# Patient Record
Sex: Female | Born: 1975 | Race: Black or African American | Hispanic: No | Marital: Married | Smoking: Former smoker
Health system: Southern US, Community
[De-identification: ages and names within clinical notes are randomized; demographics above are authoritative.]

## PROBLEM LIST (undated history)

## (undated) DIAGNOSIS — G43909 Migraine, unspecified, not intractable, without status migrainosus: Secondary | ICD-10-CM

## (undated) DIAGNOSIS — U071 COVID-19: Secondary | ICD-10-CM

## (undated) DIAGNOSIS — T7840XA Allergy, unspecified, initial encounter: Secondary | ICD-10-CM

## (undated) DIAGNOSIS — E0500 Thyrotoxicosis with diffuse goiter without thyrotoxic crisis or storm: Secondary | ICD-10-CM

## (undated) DIAGNOSIS — S42301A Unspecified fracture of shaft of humerus, right arm, initial encounter for closed fracture: Secondary | ICD-10-CM

## (undated) HISTORY — DX: Unspecified fracture of shaft of humerus, right arm, initial encounter for closed fracture: S42.301A

## (undated) HISTORY — DX: Allergy, unspecified, initial encounter: T78.40XA

## (undated) HISTORY — PX: OTHER SURGICAL HISTORY: SHX169

## (undated) HISTORY — DX: Migraine, unspecified, not intractable, without status migrainosus: G43.909

## (undated) HISTORY — DX: COVID-19: U07.1

## (undated) HISTORY — DX: Thyrotoxicosis with diffuse goiter without thyrotoxic crisis or storm: E05.00

---

## 2013-03-27 LAB — HM COLONOSCOPY

## 2015-12-16 LAB — HM PAP SMEAR: HM Pap smear: NORMAL

## 2020-02-26 ENCOUNTER — Encounter: Payer: Self-pay | Admitting: Internal Medicine

## 2020-02-26 ENCOUNTER — Ambulatory Visit (INDEPENDENT_AMBULATORY_CARE_PROVIDER_SITE_OTHER): Payer: BC Managed Care – PPO | Admitting: Internal Medicine

## 2020-02-26 ENCOUNTER — Other Ambulatory Visit: Payer: Self-pay

## 2020-02-26 VITALS — BP 110/80 | HR 62 | Temp 97.4°F | Ht 69.76 in | Wt 146.6 lb

## 2020-02-26 DIAGNOSIS — Z13220 Encounter for screening for lipoid disorders: Secondary | ICD-10-CM

## 2020-02-26 DIAGNOSIS — Z0000 Encounter for general adult medical examination without abnormal findings: Secondary | ICD-10-CM

## 2020-02-26 DIAGNOSIS — E0500 Thyrotoxicosis with diffuse goiter without thyrotoxic crisis or storm: Secondary | ICD-10-CM | POA: Insufficient documentation

## 2020-02-26 DIAGNOSIS — G43911 Migraine, unspecified, intractable, with status migrainosus: Secondary | ICD-10-CM

## 2020-02-26 DIAGNOSIS — G43109 Migraine with aura, not intractable, without status migrainosus: Secondary | ICD-10-CM

## 2020-02-26 DIAGNOSIS — Z1231 Encounter for screening mammogram for malignant neoplasm of breast: Secondary | ICD-10-CM

## 2020-02-26 DIAGNOSIS — Z113 Encounter for screening for infections with a predominantly sexual mode of transmission: Secondary | ICD-10-CM

## 2020-02-26 DIAGNOSIS — Z1329 Encounter for screening for other suspected endocrine disorder: Secondary | ICD-10-CM

## 2020-02-26 DIAGNOSIS — E559 Vitamin D deficiency, unspecified: Secondary | ICD-10-CM

## 2020-02-26 DIAGNOSIS — D61818 Other pancytopenia: Secondary | ICD-10-CM

## 2020-02-26 DIAGNOSIS — Z1159 Encounter for screening for other viral diseases: Secondary | ICD-10-CM

## 2020-02-26 DIAGNOSIS — E538 Deficiency of other specified B group vitamins: Secondary | ICD-10-CM

## 2020-02-26 DIAGNOSIS — Z1389 Encounter for screening for other disorder: Secondary | ICD-10-CM

## 2020-02-26 DIAGNOSIS — M2550 Pain in unspecified joint: Secondary | ICD-10-CM

## 2020-02-26 DIAGNOSIS — G43909 Migraine, unspecified, not intractable, without status migrainosus: Secondary | ICD-10-CM | POA: Insufficient documentation

## 2020-02-26 MED ORDER — SUMATRIPTAN SUCCINATE 25 MG PO TABS: 25.0000 mg | ORAL_TABLET | ORAL | 5 refills | Status: DC | PRN

## 2020-02-26 NOTE — Progress Notes (Signed)
Chief Complaint  Patient presents with  . Establish Care   New pt Marie Lewis  1. Migraines 2x last week and since middle school was on meds but doesn't remember name of meds now doing excedrine migraine otc gets nausea/slurred speech, loss of feeling in extremities and some nausea. She may have been on propranolol in the past. 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 This h/a pain will last x 6-7 hours and wants medications. She does wear glasses at night for night driving  2. Graves dx'ed since 2014 was on methimazole x 2 years and saw endocrine but moved and does not have  Review of Systems  Constitutional: Negative for weight loss.  HENT: Negative for hearing loss.   Eyes: Negative for blurred vision.  Respiratory: Negative for shortness of breath.   Cardiovascular: Negative for chest pain.  Gastrointestinal: Negative for abdominal pain.  Musculoskeletal: Negative for falls.  Skin: Negative for rash.  Neurological: Negative for headaches.  Psychiatric/Behavioral: Negative for depression.   Past Medical History:  Diagnosis Date  . Graves disease    was on methimazole and disc thyroid removal and RAI never done in the past dx' in 2014 on meds x 2 years saw endocrine in [place]  . Migraines   . Right arm fracture    3rd grade    Past Surgical History:  Procedure Laterality Date  . teeth extracted     x4 for braces   Family History  Problem Relation Age of Onset  . Arthritis Mother   . Diabetes Mother   . Hypertension Mother   . Diabetes Father   . Hypertension Father        ? FH of dad he is adopted  . Ovarian cancer Sister   . Depression Sister   . Breast cancer Maternal Aunt        mid 57s   . Prostate cancer Maternal Uncle        x 2 uncles   Social History   Socioeconomic History  . Marital status: Married    Spouse name: Not on file  . Number of children: Not on file  . Years of education: Not on file   . Highest education level: Not on file  Occupational History  . Not on file  Tobacco Use  . Smoking status: Former Smoker    Types: Cigars  . Smokeless tobacco: Never Used  . Tobacco comment: Former social cigar smoker.   Substance and Sexual Activity  . Alcohol use: Not on file  . Drug use: Not on file  . Sexual activity: Not on file  Other Topics Concern  . Not on file  Social History Narrative   Married 3 kids as of 02/26/20 girl 30 y.o, boy 46 y.o and girl 44 y.o   Masters degree school counselor    From [place]   No etoh or smoking    Social Determinants of Health   Financial Resource Strain:   . Difficulty of Paying Living Expenses:   Food Insecurity:   . Worried About Programme researcher, broadcasting/film/video in the Last Year:   . Barista in the Last Year:   Transportation Needs:   . Freight forwarder (Medical):   Marland Kitchen Lack of Transportation (Non-Medical):   Physical Activity:   . Days of Exercise per Week:   . Minutes of Exercise per Session:   Stress:   . Feeling of  Stress :   Social Connections:   . Frequency of Communication with Friends and Family:   . Frequency of Social Gatherings with Friends and Family:   . Attends Religious Services:   . Active Member of Clubs or Organizations:   . Attends Archivist Meetings:   Marland Kitchen Marital Status:   Intimate Partner Violence:   . Fear of Current or Ex-Partner:   . Emotionally Abused:   Marland Kitchen Physically Abused:   . Sexually Abused:    Current Meds  Medication Sig  . Multiple Vitamin (MULTIVITAMIN ADULT PO) Take by mouth daily.   Allergies  Allergen Reactions  . [place]     And related raw fruits: apples, peaches, prunes, pears, cherries, apricots, carrots, celery, almonds, hazelnuts and peanuts.   No results found for this or any previous visit (from the past 2160 hour(s)). Objective  Body mass index is 21.18 kg/m. Wt Readings from Last 3 Encounters:  02/26/20 146 lb 9.6 oz (66.5 kg)   Temp Readings from  Last 3 Encounters:  02/26/20 (!) 97.4 F (36.3 C) (Temporal)   BP Readings from Last 3 Encounters:  02/26/20 110/80   Pulse Readings from Last 3 Encounters:  02/26/20 62    Physical Exam Vitals and nursing note reviewed.  Constitutional:      Appearance: Normal appearance. She is well-developed and well-groomed.  HENT:     Head: Normocephalic and atraumatic.  Eyes:     Conjunctiva/sclera: Conjunctivae normal.     Pupils: Pupils are equal, round, and reactive to light.  Cardiovascular:     Rate and Rhythm: Normal rate and regular rhythm.     Heart sounds: Normal heart sounds. No murmur.  Pulmonary:     Effort: Pulmonary effort is normal.     Breath sounds: Normal breath sounds.  Skin:    General: Skin is warm and dry.  Neurological:     General: No focal deficit present.     Mental Status: She is alert and oriented to person, place, and time. Mental status is at baseline.     Gait: Gait normal.  Psychiatric:        Attention and Perception: Attention and perception normal.        Mood and Affect: Mood and affect normal.        Speech: Speech normal.        Behavior: Behavior normal. Behavior is cooperative.        Thought Content: Thought content normal.        Cognition and Memory: Cognition and memory normal.        Judgment: Judgment normal.     Assessment  Plan  Graves disease - Plan: TSH, T4, free, T3, free Likely will need to establish with endocrine   Intractable migraine with status migrainosus, unspecified migraine type - Plan: SUMAtriptan (IMITREX) 25 MG tablet F/u in 3 months migraines seem complicated consider neurology in the future given options today  Consider prn zofran  HM -cpe at f/u  sch fasting labs Flu shot did not have 2020 Tdap thinks w/in 10 years covid 2/2 moderna  hpv vaccine consider in future  mammo referred [facility] Pap h/o abnormal Dr. Leonides Schanz appt pending 04/2020 lmp 02/22/20  Colonoscopy consider age 62; EGD.colonoscopy  [place] had in the past get copy of report Never smoker  ROI Dr. Martinique Garrison in [facility]   Provider: Dr. Olivia Mackie McLean-Scocuzza-Internal Medicine

## 2020-02-26 NOTE — Patient Instructions (Addendum)
Dr. Stormy Fabian*  [facility]  Dr. Jose Persia in [place] (braces)   Eye  [place] [facility]  [facility] eye  Dr. B nice  Dr. Lucrezia Starch [facility] in [place] mammogram  [facility] or [facility] or [facility]  Magnesium oxide 250-400 mg daily    Dr. Sherryll Burger [facility] [place]   [facility] neurology  [facility]   [facility] Dr. Blair Heys     Eletriptan tablets What is this medicine? ELETRIPTAN (el ih TRIP tan) is used to treat migraines with or without aura. An aura is a strange feeling or visual disturbance that warns you of an attack. It is not used to prevent migraines. This medicine may be used for other purposes; ask your health care provider or pharmacist if you have questions. COMMON BRAND NAME(S): Relpax What should I tell my health care provider before I take this medicine? They need to know if you have any of these conditions:  cigarette smoker  circulation problems in fingers and toes  diabetes  heart disease  high blood pressure  high cholesterol  history of irregular heartbeat  history of stroke  kidney disease  liver disease  stomach or intestine problems  an unusual or allergic reaction to eletriptan, other medicines, foods, dyes, or preservatives  pregnant or trying to get pregnant  breast-feeding How should I use this medicine? Take this medicine by mouth with a glass of water. Follow the directions on the prescription label. Do not take it more often than directed. Talk to your pediatrician regarding the use of this medicine in children. Special care may be needed. Overdosage: If you think you have taken too much of this medicine contact a poison control center or emergency room at once. NOTE: This medicine is only for you. Do not share this medicine with others. What if I miss a dose? This does not apply. This medicine is not for regular use. What may  interact with this medicine? Do not take this medicine with any of the following medications:  ceritinib  certain antibiotics like clarithromycin or telithromycin  certain antivirals for HIV or hepatitis  certain medicines for fungal infections like ketoconazole, itraconazole, or posaconazole  certain medicines for migraine headache like almotriptan, eletriptan, frovatriptan, naratriptan, rizatriptan, sumatriptan, zolmitriptan  chloramphenicol  conivaptan  ergot alkaloids like dihydroergotamine, ergonovine, ergotamine, methylergonovine  idelalisib  mifepristone  nefazodone  ribociclib This medicine may also interact with the following medications:  certain medicines for depression, anxiety, or psychotic disorders  MAOIs like Carbex, Eldepryl, Marplan, Nardil, and Parnate This list may not describe all possible interactions. Give your health care provider a list of all the medicines, herbs, non-prescription drugs, or dietary supplements you use. Also tell them if you smoke, drink alcohol, or use illegal drugs. Some items may interact with your medicine. What should I watch for while using this medicine? Visit your healthcare professional for regular checks on your progress. Tell your healthcare professional if your symptoms do not start to get better or if they get worse. You may get drowsy or dizzy. Do not drive, use machinery, or do anything that needs mental alertness until you know how this medicine affects you. Do not stand up or sit up quickly, especially if you are an older patient. This reduces the risk of dizzy or fainting spells. Alcohol may interfere with the effect of this medicine. Your mouth may get dry. Chewing sugarless gum or sucking hard candy and  drinking plenty of water may help. Contact your healthcare professional if the problem does not go away or is severe. If you take migraine medicines for 10 or more days a month, your migraines may get worse. Keep a diary  of headache days and medicine use. Contact your healthcare professional if your migraine attacks occur more frequently. What side effects may I notice from receiving this medicine? Side effects that you should report to your doctor or health care professional as soon as possible:  allergic reactions like skin rash, itching or hives, swelling of the face, lips, or tongue  chest pain or chest tightness  signs and symptoms of a dangerous change in heartbeat or heart rhythm like chest pain; dizziness; fast, irregular heartbeat; palpitations; feeling faint or lightheaded; falls; breathing problems  signs and symptoms of a stroke like changes in vision; confusion; trouble speaking or understanding; severe headaches; sudden numbness or weakness of the face, arm or leg; trouble walking; dizziness; loss of balance or coordination  signs and symptoms of serotonin syndrome like irritable; confusion; diarrhea; fast or irregular heartbeat; muscle twitching; stiff muscles; trouble walking; sweating; high fever; seizures; chills; vomiting Side effects that usually do not require medical attention (report to your doctor or health care professional if they continue or are bothersome):  diarrhea  dizziness  drowsiness  dry mouth  headache  nausea, vomiting  pain, tingling, numbness in the hands or feet  stomach pain This list may not describe all possible side effects. Call your doctor for medical advice about side effects. You may report side effects to FDA at 1-800-FDA-1088. Where should I keep my medicine? Keep out of the reach of children. Store at room temperature between 15 and 30 degrees C (59 and 86 degrees F). Throw away any unused medicine after the expiration date. NOTE: This sheet is a summary. It may not cover all possible information. If you have questions about this medicine, talk to your doctor, pharmacist, or health care provider.  2020 Elsevier/Gold Standard (2018-03-20  14:44:50)  Sumatriptan tablets What is this medicine? SUMATRIPTAN (soo ma TRIP tan) is used to treat migraines with or without aura. An aura is a strange feeling or visual disturbance that warns you of an attack. It is not used to prevent migraines. This medicine may be used for other purposes; ask your health care provider or pharmacist if you have questions. COMMON BRAND NAME(S): Imitrex, Migraine Pack What should I tell my health care provider before I take this medicine? They need to know if you have any of these conditions:  cigarette smoker  circulation problems in fingers and toes  diabetes  heart disease  high blood pressure  high cholesterol  history of irregular heartbeat  history of stroke  kidney disease  liver disease  stomach or intestine problems  an unusual or allergic reaction to sumatriptan, other medicines, foods, dyes, or preservatives  pregnant or trying to get pregnant  breast-feeding How should I use this medicine? Take this medicine by mouth with a glass of water. Follow the directions on the prescription label. Do not take it more often than directed. Talk to your pediatrician regarding the use of this medicine in children. Special care may be needed. Overdosage: If you think you have taken too much of this medicine contact a poison control center or emergency room at once. NOTE: This medicine is only for you. Do not share this medicine with others. What if I miss a dose? This does not apply. This medicine  is not for regular use. What may interact with this medicine? Do not take this medicine with any of the following medicines:  certain medicines for migraine headache like almotriptan, eletriptan, frovatriptan, naratriptan, rizatriptan, sumatriptan, zolmitriptan  ergot alkaloids like dihydroergotamine, ergonovine, ergotamine, methylergonovine  MAOIs like Carbex, Eldepryl, Marplan, Nardil, and Parnate This medicine may also interact with  the following medications:  certain medicines for depression, anxiety, or psychotic disorders This list may not describe all possible interactions. Give your health care provider a list of all the medicines, herbs, non-prescription drugs, or dietary supplements you use. Also tell them if you smoke, drink alcohol, or use illegal drugs. Some items may interact with your medicine. What should I watch for while using this medicine? Visit your healthcare professional for regular checks on your progress. Tell your healthcare professional if your symptoms do not start to get better or if they get worse. You may get drowsy or dizzy. Do not drive, use machinery, or do anything that needs mental alertness until you know how this medicine affects you. Do not stand up or sit up quickly, especially if you are an older patient. This reduces the risk of dizzy or fainting spells. Alcohol may interfere with the effect of this medicine. Tell your healthcare professional right away if you have any change in your eyesight. If you take migraine medicines for 10 or more days a month, your migraines may get worse. Keep a diary of headache days and medicine use. Contact your healthcare professional if your migraine attacks occur more frequently. What side effects may I notice from receiving this medicine? Side effects that you should report to your doctor or health care professional as soon as possible:  allergic reactions like skin rash, itching or hives, swelling of the face, lips, or tongue  changes in vision  chest pain or chest tightness  signs and symptoms of a dangerous change in heartbeat or heart rhythm like chest pain; dizziness; fast, irregular heartbeat; palpitations; feeling faint or lightheaded; falls; breathing problems  signs and symptoms of a stroke like changes in vision; confusion; trouble speaking or understanding; severe headaches; sudden numbness or weakness of the face, arm or leg; trouble  walking; dizziness; loss of balance or coordination  signs and symptoms of serotonin syndrome like irritable; confusion; diarrhea; fast or irregular heartbeat; muscle twitching; stiff muscles; trouble walking; sweating; high fever; seizures; chills; vomiting Side effects that usually do not require medical attention (report to your doctor or health care professional if they continue or are bothersome):  diarrhea  dizziness  drowsiness  dry mouth  headache  nausea, vomiting  pain, tingling, numbness in the hands or feet  stomach pain This list may not describe all possible side effects. Call your doctor for medical advice about side effects. You may report side effects to FDA at 1-800-FDA-1088. Where should I keep my medicine? Keep out of the reach of children. Store at room temperature between 2 and 30 degrees C (36 and 86 degrees F). Throw away any unused medicine after the expiration date. NOTE: This sheet is a summary. It may not cover all possible information. If you have questions about this medicine, talk to your doctor, pharmacist, or health care provider.  2020 Elsevier/Gold Standard (2018-03-20 15:05:37)  Migraine Headache A migraine headache is an intense, throbbing pain on one side or both sides of the head. Migraine headaches may also cause other symptoms, such as nausea, vomiting, and sensitivity to light and noise. A migraine headache can last  from 4 hours to 3 days. Talk with your doctor about what things may bring on (trigger) your migraine headaches. What are the causes? The exact cause of this condition is not known. However, a migraine may be caused when nerves in the brain become irritated and release chemicals that cause inflammation of blood vessels. This inflammation causes pain. This condition may be triggered or caused by:  Drinking alcohol.  Smoking.  Taking medicines, such as: ? Medicine used to treat chest pain (nitroglycerin). ? Birth control  pills. ? Estrogen. ? Certain blood pressure medicines.  Eating or drinking products that contain nitrates, glutamate, aspartame, or tyramine. Aged cheeses, chocolate, or caffeine may also be triggers.  Doing physical activity. Other things that may trigger a migraine headache include:  Menstruation.  Pregnancy.  Hunger.  Stress.  Lack of sleep or too much sleep.  Weather changes.  Fatigue. What increases the risk? The following factors may make you more likely to experience migraine headaches:  Being a certain age. This condition is more common in people who are 83-35 years old.  Being female.  Having a family history of migraine headaches.  Being Caucasian.  Having a mental health condition, such as depression or anxiety.  Being obese. What are the signs or symptoms? The main symptom of this condition is pulsating or throbbing pain. This pain may:  Happen in any area of the head, such as on one side or both sides.  Interfere with daily activities.  Get worse with physical activity.  Get worse with exposure to bright lights or loud noises. Other symptoms may include:  Nausea.  Vomiting.  Dizziness.  General sensitivity to bright lights, loud noises, or smells. Before you get a migraine headache, you may get warning signs (an aura). An aura may include:  Seeing flashing lights or having blind spots.  Seeing bright spots, halos, or zigzag lines.  Having tunnel vision or blurred vision.  Having numbness or a tingling feeling.  Having trouble talking.  Having muscle weakness. Some people have symptoms after a migraine headache (postdromal phase), such as:  Feeling tired.  Difficulty concentrating. How is this diagnosed? A migraine headache can be diagnosed based on:  Your symptoms.  A physical exam.  Tests, such as: ? CT scan or an MRI of the head. These imaging tests can help rule out other causes of headaches. ? Taking fluid from the  spine (lumbar puncture) and analyzing it (cerebrospinal fluid analysis, or CSF analysis). How is this treated? This condition may be treated with medicines that:  Relieve pain.  Relieve nausea.  Prevent migraine headaches. Treatment for this condition may also include:  Acupuncture.  Lifestyle changes like avoiding foods that trigger migraine headaches.  Biofeedback.  Cognitive behavioral therapy. Follow these instructions at home: Medicines  Take over-the-counter and prescription medicines only as told by your health care provider.  Ask your health care provider if the medicine prescribed to you: ? Requires you to avoid driving or using heavy machinery. ? Can cause constipation. You may need to take these actions to prevent or treat constipation:  Drink enough fluid to keep your urine pale yellow.  Take over-the-counter or prescription medicines.  Eat foods that are high in fiber, such as beans, whole grains, and fresh fruits and vegetables.  Limit foods that are high in fat and processed sugars, such as fried or sweet foods. Lifestyle  Do not drink alcohol.  Do not use any products that contain nicotine or tobacco, such as  cigarettes, e-cigarettes, and chewing tobacco. If you need help quitting, ask your health care provider.  Get at least 8 hours of sleep every night.  Find ways to manage stress, such as meditation, deep breathing, or yoga. General instructions      Keep a journal to find out what may trigger your migraine headaches. For example, write down: ? What you eat and drink. ? How much sleep you get. ? Any change to your diet or medicines.  If you have a migraine headache: ? Avoid things that make your symptoms worse, such as bright lights. ? It may help to lie down in a dark, quiet room. ? Do not drive or use heavy machinery. ? Ask your health care provider what activities are safe for you while you are experiencing symptoms.  Keep all follow-up  visits as told by your health care provider. This is important. Contact a health care provider if:  You develop symptoms that are different or more severe than your usual migraine headache symptoms.  You have more than 15 headache days in one month. Get help right away if:  Your migraine headache becomes severe.  Your migraine headache lasts longer than 72 hours.  You have a fever.  You have a stiff neck.  You have vision loss.  Your muscles feel weak or like you cannot control them.  You start to lose your balance often.  You have trouble walking.  You faint.  You have a seizure. Summary  A migraine headache is an intense, throbbing pain on one side or both sides of the head. Migraines may also cause other symptoms, such as nausea, vomiting, and sensitivity to light and noise.  This condition may be treated with medicines and lifestyle changes. You may also need to avoid certain things that trigger a migraine headache.  Keep a journal to find out what may trigger your migraine headaches.  Contact your health care provider if you have more than 15 headache days in a month or you develop symptoms that are different or more severe than your usual migraine headache symptoms. This information is not intended to replace advice given to you by your health care provider. Make sure you discuss any questions you have with your health care provider. Document Revised: 12/28/2018 Document Reviewed: 10/18/2018 Elsevier Patient Education  2020 [employer].      HPV Vaccine Information for Parents  HPV (human papillomavirus) is a common virus that spreads from person to person through sexual contact. It can spread during vaginal, anal, or oral sex. There are many types of HPV viruses, and some may cause cancer. Your child can get a vaccination to prevent HPV infection and cancer. The vaccine is both safe and effective. It is recommended for boys and girls at about 12-21 years of age.  Getting the vaccination at this age--before becoming sexually active--gives your child the best chance at protection from HPV infection through adulthood. How can HPV affect my child? HPV infection can cause:  Genital warts.  Mouth or throat cancer (oropharyngeal cancer).  Anal cancer.  Cervical, vulvar, or vaginal cancer.  Penile cancer. During pregnancy, HPV infection can be passed to the baby. This infection can cause warts to develop in a baby's throat and windpipe. What actions can I take to lower my child's risk for HPV? To lower your child's risk for HPV infection, have him or her get the HPV vaccination before becoming sexually active. The best time for vaccination is between ages 3 and  12, though it can be given to children as young as 45 years old. If your child gets the first dose before age 76, the vaccination can be given as 2 shots (doses), 6-12 months apart. In some situations, 3 doses are needed:  If your child starts the vaccine before age 20 but does not have a second dose within 6-12 months, your child will need 3 doses to complete the vaccination. When your child has the first dose, it is important to make an appointment for the next shot and keep the appointment.  Teens who are not vaccinated before age 69 will need 3 doses given within 6 months.  If your child has a weak immune system, he or she may need 3 doses. Young adults can also get the vaccination, even if they are already sexually active and even if they have already been infected with HPV. The vaccination can still help prevent the types of cancer-causing HPV that a person has not been infected with. What are the risks and benefits of the HPV vaccine? Benefits The main benefit of getting vaccinated is to prevent certain cancers, including:  Cervical, vulvar, and vaginal cancer in females.  Penile cancer in males.  Oral and anal cancer in both males and females. The risk of these cancers is lower if your  child gets vaccinated before he or she becomes sexually active. The vaccine also prevents genital warts caused by HPV. Risks The risks, although low, include side effects or reactions to the vaccine. Very few reactions have been reported, but they can include:  Soreness, redness, or swelling at the injection site.  Dizziness or headache.  Fever. Who should not get the HPV vaccine or should wait to get it? Some children should not get the HPV vaccine or should wait. Discuss the risks and benefits of the vaccine with your child's health care provider if your child:  Has had a severe allergic reaction to other vaccinations.  Is allergic to yeast.  Has a fever.  Has had a recent illness.  Is pregnant or may be pregnant. Where to find more information  Centers for Disease Control and Prevention: https://www.boyd-meyer.org/  American Academy of Pediatrics: healthychildren.org Summary  HPV (human papillomavirus) is a common virus that spreads from person to person through sexual contact. It can spread during vaginal, anal, or oral sex.  Your child can get a vaccination to prevent HPV infection and cancer. It is best to get the vaccination before becoming sexually active.  The HPV vaccine can protect your child from genital warts and certain types of cancer, including cancer of the cervix, throat, mouth, vulva, vagina, anus, and penis.  The HPV vaccine is both safe and effective.  The best time for boys and girls to get the vaccination is when they are between ages 55 and 43. This information is not intended to replace advice given to you by your health care provider. Make sure you discuss any questions you have with your health care provider. Document Revised: 02/25/2019 Document Reviewed: 11/23/2017 Elsevier Patient Education  [employer].

## 2020-02-27 ENCOUNTER — Other Ambulatory Visit (INDEPENDENT_AMBULATORY_CARE_PROVIDER_SITE_OTHER): Payer: BC Managed Care – PPO

## 2020-02-27 ENCOUNTER — Other Ambulatory Visit: Payer: BC Managed Care – PPO

## 2020-02-27 DIAGNOSIS — Z13220 Encounter for screening for lipoid disorders: Secondary | ICD-10-CM

## 2020-02-27 DIAGNOSIS — Z1389 Encounter for screening for other disorder: Secondary | ICD-10-CM

## 2020-02-27 DIAGNOSIS — Z0000 Encounter for general adult medical examination without abnormal findings: Secondary | ICD-10-CM | POA: Diagnosis not present

## 2020-02-27 DIAGNOSIS — E0500 Thyrotoxicosis with diffuse goiter without thyrotoxic crisis or storm: Secondary | ICD-10-CM | POA: Diagnosis not present

## 2020-02-27 DIAGNOSIS — E559 Vitamin D deficiency, unspecified: Secondary | ICD-10-CM | POA: Diagnosis not present

## 2020-02-27 DIAGNOSIS — Z1329 Encounter for screening for other suspected endocrine disorder: Secondary | ICD-10-CM

## 2020-02-27 LAB — COMPREHENSIVE METABOLIC PANEL
ALT: 8 U/L (ref 0–35)
AST: 13 U/L (ref 0–37)
Albumin: 4.5 g/dL (ref 3.5–5.2)
Alkaline Phosphatase: 45 U/L (ref 39–117)
BUN: 7 mg/dL (ref 6–23)
CO2: 26 mEq/L (ref 19–32)
Calcium: 9.1 mg/dL (ref 8.4–10.5)
Chloride: 105 mEq/L (ref 96–112)
Creatinine, Ser: 0.86 mg/dL (ref 0.40–1.20)
GFR: 86.58 mL/min (ref >60.00)
Glucose, Bld: 83 mg/dL (ref 70–99)
Potassium: 3.9 mEq/L (ref 3.5–5.1)
Sodium: 137 mEq/L (ref 135–145)
Total Bilirubin: 0.4 mg/dL (ref 0.2–1.2)
Total Protein: 8 g/dL (ref 6.0–8.3)

## 2020-02-27 LAB — LIPID PANEL
Cholesterol: 113 mg/dL (ref 0–200)
HDL: 42.3 mg/dL (ref >39.00)
LDL Cholesterol: 62 mg/dL (ref 0–99)
NonHDL: 71.04
Total CHOL/HDL Ratio: 3
Triglycerides: 43 mg/dL (ref 0.0–149.0)
VLDL: 8.6 mg/dL (ref 0.0–40.0)

## 2020-02-27 LAB — CBC WITH DIFFERENTIAL/PLATELET
Basophils Absolute: 0 10*3/uL (ref 0.0–0.1)
Basophils Relative: 0.3 % (ref 0.0–3.0)
Eosinophils Absolute: 0.1 10*3/uL (ref 0.0–0.7)
Eosinophils Relative: 1.7 % (ref 0.0–5.0)
HCT: 34.6 % — ABNORMAL LOW (ref 36.0–46.0)
Hemoglobin: 11.3 g/dL — ABNORMAL LOW (ref 12.0–15.0)
Lymphocytes Relative: 52.1 % — ABNORMAL HIGH (ref 12.0–46.0)
Lymphs Abs: 1.5 10*3/uL (ref 0.7–4.0)
MCHC: 32.6 g/dL (ref 30.0–36.0)
MCV: 80.3 fl (ref 78.0–100.0)
Monocytes Absolute: 0.3 10*3/uL (ref 0.1–1.0)
Monocytes Relative: 9.8 % (ref 3.0–12.0)
Neutro Abs: 1.1 10*3/uL — ABNORMAL LOW (ref 1.4–7.7)
Neutrophils Relative %: 36.1 % — ABNORMAL LOW (ref 43.0–77.0)
Platelets: 113 10*3/uL — ABNORMAL LOW (ref 150.0–400.0)
RBC: 4.31 Mil/uL (ref 3.87–5.11)
RDW: 14.6 % (ref 11.5–15.5)
WBC: 3 10*3/uL — ABNORMAL LOW (ref 4.0–10.5)

## 2020-02-27 LAB — VITAMIN D 25 HYDROXY (VIT D DEFICIENCY, FRACTURES): VITD: 30.57 ng/mL (ref 30.00–100.00)

## 2020-02-27 LAB — T4, FREE: Free T4: 0.75 ng/dL (ref 0.60–1.60)

## 2020-02-27 LAB — T3, FREE: T3, Free: 3.1 pg/mL (ref 2.3–4.2)

## 2020-02-27 LAB — TSH: TSH: 0.6 u[IU]/mL (ref 0.35–4.50)

## 2020-02-28 ENCOUNTER — Telehealth: Payer: Self-pay | Admitting: Internal Medicine

## 2020-02-28 LAB — URINALYSIS, ROUTINE W REFLEX MICROSCOPIC
Bacteria, UA: NONE SEEN /HPF
Bilirubin Urine: NEGATIVE
Glucose, UA: NEGATIVE
Hyaline Cast: NONE SEEN /LPF
Ketones, ur: NEGATIVE
Leukocytes,Ua: NEGATIVE
Nitrite: NEGATIVE
Protein, ur: NEGATIVE
Specific Gravity, Urine: 1.015 (ref 1.001–1.03)
WBC, UA: NONE SEEN /HPF (ref 0–5)
pH: 6.5 (ref 5.0–8.0)

## 2020-02-28 NOTE — Telephone Encounter (Signed)
Left message to return call. Needing to speak with patient urgently today.

## 2020-02-28 NOTE — Telephone Encounter (Signed)
Left message to return call 

## 2020-02-28 NOTE — Telephone Encounter (Signed)
-----   Message from Bevelyn Buckles, MD sent at 02/28/2020  7:57 AM EDT ----- Have pt activate my chart please   Cholesterol normal Liver kidneys normal Thyroid labs normal  Vitamin D low normal rec otc vitamin D3 2000 IU daily   All of blood counts are down  -white blood cell count, she is anemia and platelets low  -I recommend hematology consult  -is she agreeable hematology at [facility] or locally here [facility]?   Urine 2+ blood was she on cycle? Any UTI sx's?   I want to order more labs like HIV, etc to work her up before seeing hematology, is she agreeable?   Is she ok with MRI brain for migraines/h/a?   We have to use dye to get a better pic ok with this?   Sch repeat labs in our office if wanted by pt and I will order further labs until can f/u hematology

## 2020-03-02 ENCOUNTER — Telehealth: Payer: Self-pay

## 2020-03-02 NOTE — Telephone Encounter (Signed)
Please place future lab orders. Thank you! 

## 2020-03-03 DIAGNOSIS — D61818 Other pancytopenia: Secondary | ICD-10-CM

## 2020-03-03 HISTORY — DX: Other pancytopenia: D61.818

## 2020-03-03 NOTE — Addendum Note (Signed)
Addended by: Quentin Ore on: 03/03/2020 08:01 AM   Modules accepted: Orders

## 2020-03-04 ENCOUNTER — Other Ambulatory Visit (INDEPENDENT_AMBULATORY_CARE_PROVIDER_SITE_OTHER): Payer: BC Managed Care – PPO

## 2020-03-04 ENCOUNTER — Other Ambulatory Visit
Admission: RE | Admit: 2020-03-04 | Discharge: 2020-03-04 | Disposition: A | Discharge status: Home / Self Care | Payer: BC Managed Care – PPO | Attending: Internal Medicine | Admitting: Internal Medicine

## 2020-03-04 ENCOUNTER — Other Ambulatory Visit: Payer: Self-pay

## 2020-03-04 DIAGNOSIS — M2550 Pain in unspecified joint: Secondary | ICD-10-CM

## 2020-03-04 DIAGNOSIS — Z1159 Encounter for screening for other viral diseases: Secondary | ICD-10-CM

## 2020-03-04 DIAGNOSIS — E538 Deficiency of other specified B group vitamins: Secondary | ICD-10-CM | POA: Diagnosis not present

## 2020-03-04 DIAGNOSIS — G43109 Migraine with aura, not intractable, without status migrainosus: Secondary | ICD-10-CM

## 2020-03-04 DIAGNOSIS — D61818 Other pancytopenia: Secondary | ICD-10-CM | POA: Diagnosis not present

## 2020-03-04 DIAGNOSIS — Z113 Encounter for screening for infections with a predominantly sexual mode of transmission: Secondary | ICD-10-CM

## 2020-03-04 LAB — VITAMIN B12: Vitamin B-12: 328 pg/mL (ref 211–911)

## 2020-03-04 LAB — FOLATE: Folate: 19.6 ng/mL (ref >5.9)

## 2020-03-04 LAB — C-REACTIVE PROTEIN: CRP: <1 mg/dL (ref 0.5–20.0)

## 2020-03-04 LAB — SEDIMENTATION RATE: Sed Rate: 19 mm/hr (ref 0–20)

## 2020-03-04 NOTE — Addendum Note (Signed)
Addended by: Aerilynn Goin I on: 03/04/2020 09:45 AM   Modules accepted: Orders  

## 2020-03-04 NOTE — Addendum Note (Signed)
Addended by: Kamorie Aldous I on: 03/04/2020 09:44 AM   Modules accepted: Orders  

## 2020-03-04 NOTE — Addendum Note (Signed)
Addended by: Bonnell Public I on: 03/04/2020 09:45 AM   Modules accepted: Orders

## 2020-03-04 NOTE — Addendum Note (Signed)
Addended by: Jalisha Enneking L on: 03/04/2020 09:07 AM ° ° Modules accepted: Orders ° °

## 2020-03-04 NOTE — Addendum Note (Signed)
Addended by: Bonnell Public I on: 03/04/2020 09:44 AM   Modules accepted: Orders

## 2020-03-04 NOTE — Addendum Note (Signed)
Addended by: Glendel Jaggers I on: 03/04/2020 09:44 AM   Modules accepted: Orders  

## 2020-03-04 NOTE — Addendum Note (Signed)
Addended by: Harrie Foreman on: 03/04/2020 09:08 AM   Modules accepted: Orders

## 2020-03-04 NOTE — Addendum Note (Signed)
Addended by: Marlon Vonruden L on: 03/04/2020 09:07 AM ° ° Modules accepted: Orders ° °

## 2020-03-04 NOTE — Addendum Note (Signed)
Addended by: Jaylena Holloway L on: 03/04/2020 09:08 AM ° ° Modules accepted: Orders ° °

## 2020-03-04 NOTE — Addendum Note (Signed)
Addended by: Finnean Cerami I on: 03/04/2020 09:45 AM   Modules accepted: Orders  

## 2020-03-04 NOTE — Addendum Note (Signed)
Addended by: Eliya Bubar L on: 03/04/2020 09:07 AM ° ° Modules accepted: Orders ° °

## 2020-03-04 NOTE — Addendum Note (Signed)
Addended by: Tymere Depuy L on: 03/04/2020 09:07 AM ° ° Modules accepted: Orders ° °

## 2020-03-04 NOTE — Addendum Note (Signed)
Addended by: Hikaru Delorenzo I on: 03/04/2020 09:44 AM   Modules accepted: Orders  

## 2020-03-04 NOTE — Addendum Note (Signed)
Addended by: Jazmeen Axtell I on: 03/04/2020 09:44 AM   Modules accepted: Orders  

## 2020-03-04 NOTE — Addendum Note (Signed)
Addended by: Dmoni Fortson L on: 03/04/2020 09:06 AM ° ° Modules accepted: Orders ° °

## 2020-03-04 NOTE — Addendum Note (Signed)
Addended by: Harrie Foreman on: 03/04/2020 09:06 AM   Modules accepted: Orders

## 2020-03-04 NOTE — Addendum Note (Signed)
Addended by: Ahnesti Townsend I on: 03/04/2020 09:45 AM   Modules accepted: Orders  

## 2020-03-04 NOTE — Addendum Note (Signed)
Addended by: Stran Raper I on: 03/04/2020 09:45 AM   Modules accepted: Orders  

## 2020-03-04 NOTE — Addendum Note (Signed)
Addended by: Harrie Foreman on: 03/04/2020 09:07 AM   Modules accepted: Orders

## 2020-03-04 NOTE — Addendum Note (Signed)
Addended by: Yareli Carthen L on: 03/04/2020 09:06 AM ° ° Modules accepted: Orders ° °

## 2020-03-05 ENCOUNTER — Telehealth: Payer: Self-pay | Admitting: Internal Medicine

## 2020-03-05 NOTE — Telephone Encounter (Signed)
Faxed request for patient's OBGYN records to Dr Sw[place] Garrison [facility].   ROI sent to scan.

## 2020-03-05 NOTE — Addendum Note (Signed)
Addended by: Quentin Ore on: 03/05/2020 05:44 PM   Modules accepted: Orders

## 2020-03-05 NOTE — Telephone Encounter (Signed)
Referred to hematology for pancytopenia all blood cell cts low white blood cells, blood cells and platelets   Inform pt   Marnee Guarneri, MD  [address]  [facility] [address]  [place], [place] [address]  [phone]  [phone] (Fax)

## 2020-03-06 ENCOUNTER — Telehealth: Payer: Self-pay | Admitting: Internal Medicine

## 2020-03-06 LAB — HIV ANTIBODY (ROUTINE TESTING W REFLEX): HIV 1&2 Ab, 4th Generation: NONREACTIVE

## 2020-03-06 LAB — HEPATITIS B SURFACE ANTIGEN: Hepatitis B Surface Ag: NONREACTIVE

## 2020-03-06 LAB — HEPATITIS B SURFACE ANTIBODY, QUANTITATIVE: Hep B S AB Quant (Post): <5 m[IU]/mL — ABNORMAL LOW (ref >=10)

## 2020-03-06 LAB — ANTI-NUCLEAR AB-TITER (ANA TITER): ANA Titer 1: 1:640 {titer} — ABNORMAL HIGH

## 2020-03-06 LAB — ANA: Anti Nuclear Antibody (ANA): POSITIVE — AB

## 2020-03-06 LAB — HEPATITIS C ANTIBODY
Hepatitis C Ab: NONREACTIVE
SIGNAL TO CUT-OFF: 0.08 (ref <1.00)

## 2020-03-06 LAB — CYCLIC CITRUL PEPTIDE ANTIBODY, IGG: Cyclic Citrullin Peptide Ab: <16 UNITS

## 2020-03-06 LAB — RHEUMATOID FACTOR: Rheumatoid fact SerPl-aCnc: <14 IU/mL (ref <14)

## 2020-03-06 NOTE — Telephone Encounter (Signed)
Left message to return call 

## 2020-03-06 NOTE — Telephone Encounter (Signed)
Left pt vm to call ofc regrading referral.

## 2020-03-06 NOTE — Telephone Encounter (Signed)
Left message to return call.  Letter mailed.

## 2020-03-20 NOTE — Telephone Encounter (Signed)
Patient was informed on 06/30 when called for her lab results.

## 2020-03-26 ENCOUNTER — Encounter: Payer: Self-pay | Admitting: Internal Medicine

## 2020-03-26 ENCOUNTER — Telehealth: Payer: Self-pay | Admitting: Internal Medicine

## 2020-03-26 DIAGNOSIS — E0500 Thyrotoxicosis with diffuse goiter without thyrotoxic crisis or storm: Secondary | ICD-10-CM

## 2020-03-26 NOTE — Telephone Encounter (Signed)
[facility]?   Thanks [employer]

## 2020-03-26 NOTE — Telephone Encounter (Signed)
Is [facility]ogram scheduled [facility] in [place]?

## 2020-03-31 NOTE — Telephone Encounter (Signed)
Order was faxed to [facility] on 03/13/2020.

## 2020-04-24 ENCOUNTER — Other Ambulatory Visit: Payer: Self-pay | Admitting: Obstetrics & Gynecology

## 2020-04-24 DIAGNOSIS — Z1231 Encounter for screening mammogram for malignant neoplasm of breast: Secondary | ICD-10-CM

## 2020-04-24 LAB — HM PAP SMEAR: HM Pap smear: NORMAL

## 2020-05-13 ENCOUNTER — Other Ambulatory Visit: Payer: Self-pay

## 2020-05-13 ENCOUNTER — Other Ambulatory Visit: Payer: BC Managed Care – PPO

## 2020-05-28 ENCOUNTER — Ambulatory Visit
Admission: RE | Admit: 2020-05-28 | Discharge: 2020-05-28 | Disposition: A | Discharge status: Home / Self Care | Payer: BC Managed Care – PPO | Source: Ambulatory Visit | Attending: Obstetrics & Gynecology | Admitting: Obstetrics & Gynecology

## 2020-05-28 ENCOUNTER — Other Ambulatory Visit: Payer: Self-pay

## 2020-05-28 DIAGNOSIS — Z1231 Encounter for screening mammogram for malignant neoplasm of breast: Secondary | ICD-10-CM | POA: Diagnosis present

## 2020-06-05 ENCOUNTER — Telehealth: Payer: Self-pay | Admitting: Internal Medicine

## 2020-06-05 ENCOUNTER — Telehealth: Payer: BC Managed Care – PPO | Admitting: Internal Medicine

## 2020-06-05 NOTE — Telephone Encounter (Signed)
What happened with hematology referral?

## 2020-06-05 NOTE — Telephone Encounter (Signed)
Left message to return call. If patient returning call, okay to schedule follow up in person. Dr French Ana McLean-Scocuzza prefers in person.

## 2020-06-05 NOTE — Telephone Encounter (Signed)
Pt is scheduled on 07/21/2020.

## 2020-06-05 NOTE — Telephone Encounter (Signed)
Please resch pt if agreeable

## 2021-04-27 ENCOUNTER — Telehealth: Payer: Self-pay | Admitting: Internal Medicine

## 2021-04-27 NOTE — Telephone Encounter (Signed)
Patient is requesting office information for a neurology referral before her appointment in December with Dr.Tracy.Pleas\e advise.

## 2021-04-27 NOTE — Telephone Encounter (Signed)
What does she need we previously discussed neurology is referral needed ?    [facility] Neurology Dr. Lucia Gaskins specializes in headaches    [facility]  Dr. Sherryll Burger Neurology [place]

## 2021-04-27 NOTE — Telephone Encounter (Signed)
Please advise, Patient last seen 02/2020.

## 2021-04-28 NOTE — Telephone Encounter (Signed)
Patient needing a referral, would like to see Dr. Sherryll Burger Neurology [place]

## 2021-04-29 ENCOUNTER — Telehealth: Payer: Self-pay | Admitting: Internal Medicine

## 2021-04-29 NOTE — Telephone Encounter (Signed)
Reason for Triage Call /in person: Headaches  Symptoms:Moderate  Pain Scale:none, location, head, and level of pain (1-10, 10 severe), 6  Onset: 4 days  Duration: 4 days  Medications:medication not used, Tylenol used but not effective  Last seen for this problem:Patient has referral to neurology per patient.  Outcome: To be seen in 24 hrs. At home care was also given to patient. If sx worsen they need to be seen per access nurse.  Attach Access Nurse Triage Note.

## 2021-04-29 NOTE — Telephone Encounter (Signed)
Patient informed, Due to the high volume of calls and your symptoms we have to forward your call to our Triage Nurse to expedient your call. Please hold for the transfer.  Patient transferred to Access Nurse. Due to having increased headaches and she is unsure what to do before next appointment in December.No openings available in office or virtual.

## 2021-04-30 ENCOUNTER — Other Ambulatory Visit: Payer: Self-pay | Admitting: Internal Medicine

## 2021-04-30 ENCOUNTER — Telehealth: Payer: Self-pay | Admitting: Internal Medicine

## 2021-04-30 ENCOUNTER — Other Ambulatory Visit: Payer: Self-pay

## 2021-04-30 DIAGNOSIS — G43911 Migraine, unspecified, intractable, with status migrainosus: Secondary | ICD-10-CM

## 2021-04-30 MED ORDER — SUMATRIPTAN SUCCINATE 50 MG PO TABS: 50.0000 mg | ORAL_TABLET | ORAL | 2 refills | Status: DC | PRN

## 2021-04-30 NOTE — Telephone Encounter (Signed)
Will refer to Dr. Sherryll Burger in [place] but he has no available appts soon  Do we in the clinic?  If not rec [facility] or [facility]

## 2021-04-30 NOTE — Telephone Encounter (Signed)
Also I refilled her imitrex increased dose to 50 from 25 and max is 200 mg in 24 hours

## 2021-04-30 NOTE — Telephone Encounter (Signed)
This has been addressed see other phrine note.

## 2021-04-30 NOTE — Telephone Encounter (Signed)
I called and spoke with patient. She waited to see if anyone could see her next week in office to discuss med options for HA. She stated that she was really waiting on referral to Dr. Sherryll Burger. I let her know that the referral was placed & that she may call to see if they can get her scheduled prior to them calling her. I have provided number. She stated that she would pick up the Imitrex, but it hasn't been the best at helping her HA in the past. She stated that she still has been able to work with these HA's & that this was not the worst HA of her life. She does not have vision, changes, aura or vomiting. She will try the Imitrex & I advised that worsening sx over the weekend that she needed to be seen ASAP by UC or ED. I also asked if Imitrex did not help & she still wanted to be seen by someone here it within [place] to call us back next week to see if we had any cancellations or if another office could see her. She as offered VV today with [facility], but declined. Pt was at work & was able to function which was promising & she feels HA related to weather changes this week.

## 2021-04-30 NOTE — Addendum Note (Signed)
Addended by: Quentin Ore on: 04/30/2021 09:00 AM   Modules accepted: Orders

## 2021-04-30 NOTE — Telephone Encounter (Signed)
Rec [facility] or [facility] if not available appts  Referred to neurology they likely dont have available appts either

## 2021-04-30 NOTE — Telephone Encounter (Signed)
See other phone note. Pt has been advised.

## 2021-07-01 ENCOUNTER — Other Ambulatory Visit: Payer: Self-pay | Admitting: Certified Nurse Midwife

## 2021-07-01 DIAGNOSIS — Z1231 Encounter for screening mammogram for malignant neoplasm of breast: Secondary | ICD-10-CM

## 2021-09-07 ENCOUNTER — Encounter: Payer: Self-pay | Admitting: Internal Medicine

## 2021-09-07 ENCOUNTER — Other Ambulatory Visit: Payer: Self-pay

## 2021-09-07 ENCOUNTER — Ambulatory Visit (INDEPENDENT_AMBULATORY_CARE_PROVIDER_SITE_OTHER): Payer: BC Managed Care – PPO | Admitting: Internal Medicine

## 2021-09-07 VITALS — BP 110/64 | HR 79 | Temp 97.9°F | Ht 69.61 in | Wt 145.2 lb

## 2021-09-07 DIAGNOSIS — E559 Vitamin D deficiency, unspecified: Secondary | ICD-10-CM

## 2021-09-07 DIAGNOSIS — Z1329 Encounter for screening for other suspected endocrine disorder: Secondary | ICD-10-CM

## 2021-09-07 DIAGNOSIS — Z0000 Encounter for general adult medical examination without abnormal findings: Secondary | ICD-10-CM | POA: Diagnosis not present

## 2021-09-07 DIAGNOSIS — Z1231 Encounter for screening mammogram for malignant neoplasm of breast: Secondary | ICD-10-CM

## 2021-09-07 DIAGNOSIS — Z1389 Encounter for screening for other disorder: Secondary | ICD-10-CM | POA: Diagnosis not present

## 2021-09-07 DIAGNOSIS — Z13220 Encounter for screening for lipoid disorders: Secondary | ICD-10-CM

## 2021-09-07 DIAGNOSIS — D61818 Other pancytopenia: Secondary | ICD-10-CM

## 2021-09-07 DIAGNOSIS — Z23 Encounter for immunization: Secondary | ICD-10-CM

## 2021-09-07 DIAGNOSIS — G43919 Migraine, unspecified, intractable, without status migrainosus: Secondary | ICD-10-CM

## 2021-09-07 DIAGNOSIS — H547 Unspecified visual loss: Secondary | ICD-10-CM

## 2021-09-07 MED ORDER — TETANUS-DIPHTH-ACELL PERTUSSIS 5-2.5-18.5 LF-MCG/0.5 IM SUSP
0.5000 mL | Freq: Once | INTRAMUSCULAR | 0 refills | Status: AC
Start: 2021-09-07 — End: 2021-09-07

## 2021-09-07 NOTE — Addendum Note (Signed)
Addended by: Quentin Ore on: 09/07/2021 05:02 PM   Modules accepted: Orders

## 2021-09-07 NOTE — Progress Notes (Signed)
Chief Complaint  Patient presents with   Annual Exam   Gynecologic Exam   Annual  1. Migraine on candisartan 4 mg qd for px and helping per Dr. Sherryll Burger and prn imitrex 2. Pancytopenia had bone marrow bx with hematology [facility] and negative    Review of Systems  Constitutional:  Negative for weight loss.  HENT:  Negative for hearing loss.   Eyes:  Negative for blurred vision.  Respiratory:  Negative for shortness of breath.   Cardiovascular:  Negative for chest pain.  Gastrointestinal:  Negative for abdominal pain and blood in stool.  Genitourinary:  Negative for dysuria.  Musculoskeletal:  Negative for falls and joint pain.  Skin:  Negative for rash.  Neurological:  Negative for headaches.  Psychiatric/Behavioral:  Negative for depression.   Past Medical History:  Diagnosis Date   COVID-19    04/2021   Graves disease    was on methimazole and disc thyroid removal and RAI never done in the past dx' in 2014 on meds x 2 years saw endocrine in [facility]   Migraines    Right arm fracture    3rd grade    Past Surgical History:  Procedure Laterality Date   teeth extracted     x4 for braces   Family History  Problem Relation Age of Onset   Arthritis Mother    Diabetes Mother    Hypertension Mother    Diabetes Father    Hypertension Father        ? FH of dad he is adopted   Ovarian cancer Sister    Depression Sister    Breast cancer Maternal Aunt        mid 47s    Prostate cancer Maternal Uncle        x 2 uncles   Social History   Socioeconomic History   Marital status: Married    Spouse name: Not on file   Number of children: Not on file   Years of education: Not on file   Highest education level: Not on file  Occupational History   Not on file  Tobacco Use   Smoking status: Former    Types: Cigars   Smokeless tobacco: Never   Tobacco comments:    Former social cigar smoker.   Substance and Sexual Activity   Alcohol use: Not on file   Drug use: Not on file   Sexual  activity: Not on file  Other Topics Concern   Not on file  Social History Narrative   Married 3 kids as of 02/26/20 girl 7 y.o, boy 30 y.o and girl 45 y.o   Masters degree school counselor    From [place]   No etoh or smoking    Social Determinants of Health   Financial Resource Strain: Not on file  Food Insecurity: Not on file  Transportation Needs: Not on file  Physical Activity: Not on file  Stress: Not on file  Social Connections: Not on file  Intimate Partner Violence: Not on file   Current Meds  Medication Sig   candesartan (ATACAND) 4 MG tablet Take 4 mg by mouth daily.   Multiple Vitamin (MULTIVITAMIN ADULT PO) Take by mouth daily.   SUMAtriptan (IMITREX) 50 MG tablet Take 1 tablet (50 mg total) by mouth every 2 (two) hours as needed for migraine. May repeat in 2 hours if headache persists or recurs. No more than 200 mg total in 24 hours   Tdap (BOOSTRIX) 5-2.5-18.5 LF-MCG/0.5 injection Inject 0.5  mLs into the muscle once for 1 dose.   Allergies  Allergen Reactions   Cherry Anaphylaxis    Throat closes    Apple Hives and Itching   Peach Flavor Hives and Itching   Plum Pulp Hives and Itching   Linnell Fulling     And related raw fruits: apples, peaches, prunes, pears, cherries, apricots, carrots, celery, almonds, hazelnuts and peanuts.   No results found for this or any previous visit (from the past 2160 hour(s)). Objective  Body mass index is 21.07 kg/m. Wt Readings from Last 3 Encounters:  09/07/21 145 lb 3.2 oz (65.9 kg)  02/26/20 146 lb 9.6 oz (66.5 kg)   Temp Readings from Last 3 Encounters:  09/07/21 97.9 F (36.6 C) (Temporal)  02/26/20 (!) 97.4 F (36.3 C) (Temporal)   BP Readings from Last 3 Encounters:  09/07/21 110/64  02/26/20 110/80   Pulse Readings from Last 3 Encounters:  09/07/21 79  02/26/20 62    Physical Exam Vitals and nursing note reviewed.  Constitutional:      Appearance: Normal appearance. She is well-developed and well-groomed.   HENT:     Head: Normocephalic and atraumatic.  Eyes:     Conjunctiva/sclera: Conjunctivae normal.     Pupils: Pupils are equal, round, and reactive to light.  Cardiovascular:     Rate and Rhythm: Normal rate and regular rhythm.     Heart sounds: Normal heart sounds. No murmur heard. Pulmonary:     Effort: Pulmonary effort is normal.     Breath sounds: Normal breath sounds.  Abdominal:     General: Abdomen is flat. Bowel sounds are normal.     Tenderness: There is no abdominal tenderness.  Musculoskeletal:        General: No tenderness.  Skin:    General: Skin is warm and dry.  Neurological:     General: No focal deficit present.     Mental Status: She is alert and oriented to person, place, and time. Mental status is at baseline.     Cranial Nerves: Cranial nerves 2-12 are intact.     Gait: Gait is intact.  Psychiatric:        Attention and Perception: Attention and perception normal.        Mood and Affect: Mood and affect normal.        Speech: Speech normal.        Behavior: Behavior normal. Behavior is cooperative.        Thought Content: Thought content normal.        Cognition and Memory: Cognition and memory normal.        Judgment: Judgment normal.    Assessment  Plan  Annual physical exam See below   Need for Tdap vaccination - Plan: Tdap (BOOSTRIX) 5-2.5-18.5 LF-MCG/0.5 injection  Intractable migraine without status migrainosus, unspecified migraine type Candisartan 4 mg qd  Prn imitrex   Pancytopenia (HCC)  F/u [facility]   sch fasting labs Flu shot will get this with 4th covid shot  Tdap rx today  covid 3/3 moderna will get 4th dose  hpv vaccine consider in future   Mammo [place] Pap h/o abnormal Dr. Elesa Massed 04/24/20 pap neg neg HPV Colonoscopy consider age 70 had in 2014 and normal no FH colon cancer   Never smoker  ROI Dr. [place] Garrison in [facility] Rec healthy diet and exercise    Provider: Dr. French Ana  [facility]

## 2021-09-14 ENCOUNTER — Telehealth: Payer: Self-pay | Admitting: Internal Medicine

## 2021-09-14 NOTE — Telephone Encounter (Signed)
Advise pt colonoscopy due age 45

## 2021-09-14 NOTE — Telephone Encounter (Signed)
Patient informed and verbalized understanding.  Patient then gave me the dates of her Moderna boosters. Placed in her immunization list and updated.

## 2021-09-14 NOTE — Telephone Encounter (Signed)
Hi pt had colonoscopy in 2014 would have been 45 y.o  And was normal no FH  Would we repeat again in 2024 age 8 ? Thank you   Just clarifying

## 2021-09-14 NOTE — Telephone Encounter (Signed)
Yes, 47.  Thanks.

## 2021-09-15 ENCOUNTER — Other Ambulatory Visit: Payer: Self-pay

## 2021-09-15 ENCOUNTER — Ambulatory Visit
Admission: RE | Admit: 2021-09-15 | Discharge: 2021-09-15 | Disposition: A | Discharge status: Home / Self Care | Payer: BC Managed Care – PPO | Source: Ambulatory Visit | Attending: Internal Medicine | Admitting: Internal Medicine

## 2021-09-15 DIAGNOSIS — Z1231 Encounter for screening mammogram for malignant neoplasm of breast: Secondary | ICD-10-CM | POA: Diagnosis present

## 2021-09-16 LAB — LIPID PANEL
Chol/HDL Ratio: 2.7 ratio (ref 0.0–4.4)
Cholesterol, Total: 133 mg/dL (ref 100–199)
HDL: 50 mg/dL (ref >39)
LDL Chol Calc (NIH): 72 mg/dL (ref 0–99)
Triglycerides: 50 mg/dL (ref 0–149)
VLDL Cholesterol Cal: 11 mg/dL (ref 5–40)

## 2021-09-16 LAB — URINALYSIS, ROUTINE W REFLEX MICROSCOPIC
Bilirubin, UA: NEGATIVE
Glucose, UA: NEGATIVE
Ketones, UA: NEGATIVE
Leukocytes,UA: NEGATIVE
Nitrite, UA: NEGATIVE
Protein,UA: NEGATIVE
RBC, UA: NEGATIVE
Specific Gravity, UA: 1.019 (ref 1.005–1.030)
Urobilinogen, Ur: 0.2 mg/dL (ref 0.2–1.0)
pH, UA: 7.5 (ref 5.0–7.5)

## 2021-09-16 NOTE — Addendum Note (Signed)
Addended by: Quentin Ore on: 09/16/2021 09:28 AM   Modules accepted: Orders

## 2021-09-16 NOTE — Addendum Note (Signed)
Addended by: Quentin Ore on: 09/16/2021 10:28 AM   Modules accepted: Orders

## 2021-10-22 NOTE — Progress Notes (Signed)
Is pt agreeable to repeat colonoscopy now?   [place] [facility] [place] [facility] [place] ?   Let me know

## 2021-12-15 NOTE — Progress Notes (Signed)
Letter mailed to call for referral to GI

## 2022-05-12 IMAGING — MG MM DIGITAL SCREENING BILAT W/ TOMO AND CAD
6 of 10 series · 6 of 30 positions shown · non-contrast
Comparison: Previous exam(s).

CLINICAL DATA: Screening.

EXAM:
DIGITAL SCREENING BILATERAL MAMMOGRAM WITH TOMOSYNTHESIS AND CAD
TECHNIQUE: Bilateral screening digital craniocaudal and mediolateral oblique
mammograms were obtained. Bilateral screening digital breast
tomosynthesis was performed. The images were evaluated with
computer-aided detection.

[R MLO synth-2D (1 of 2)]
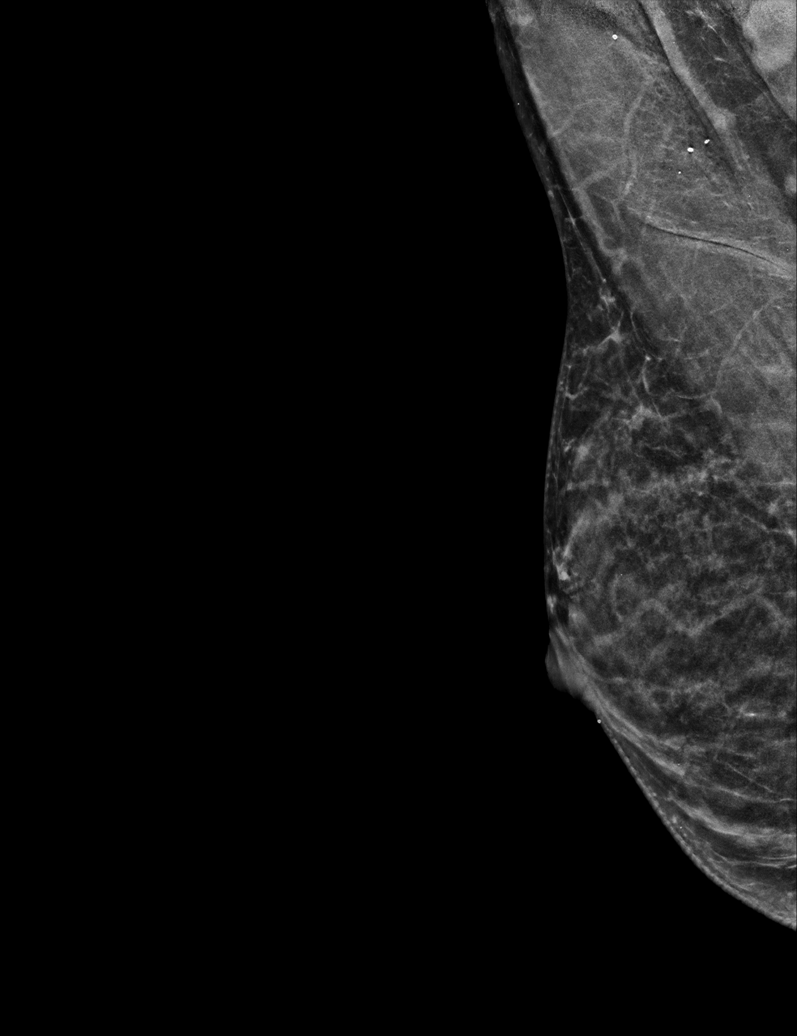

[L CC synth-2D]
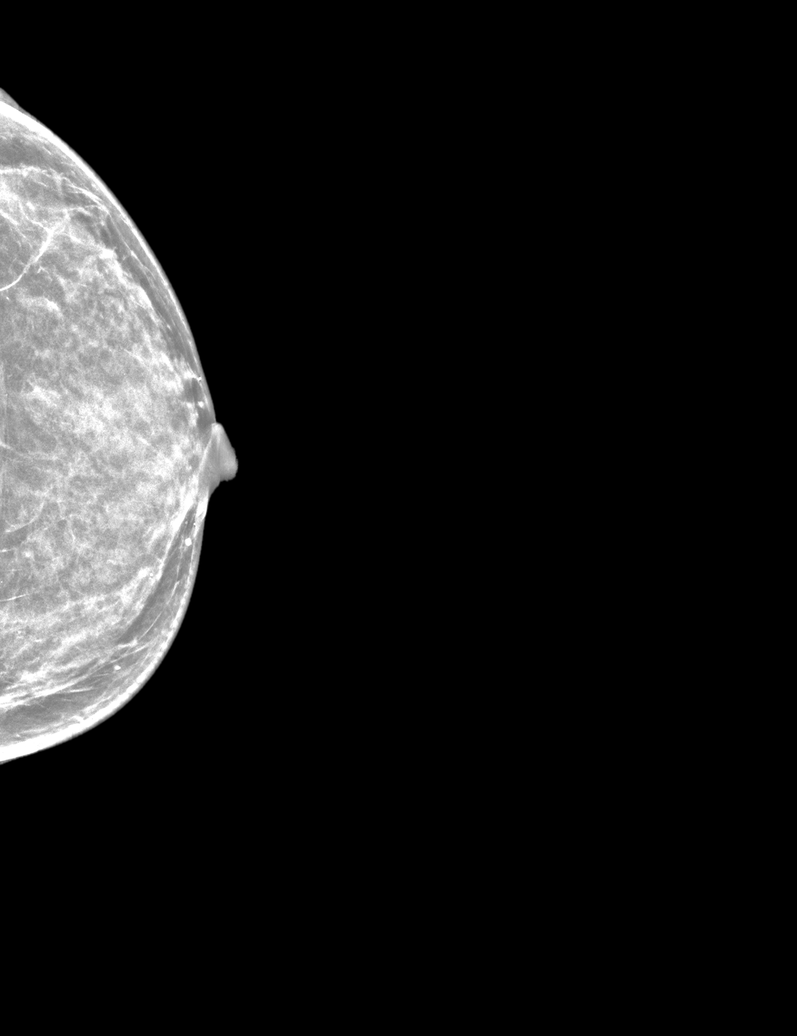

[L MLO synth-2D]
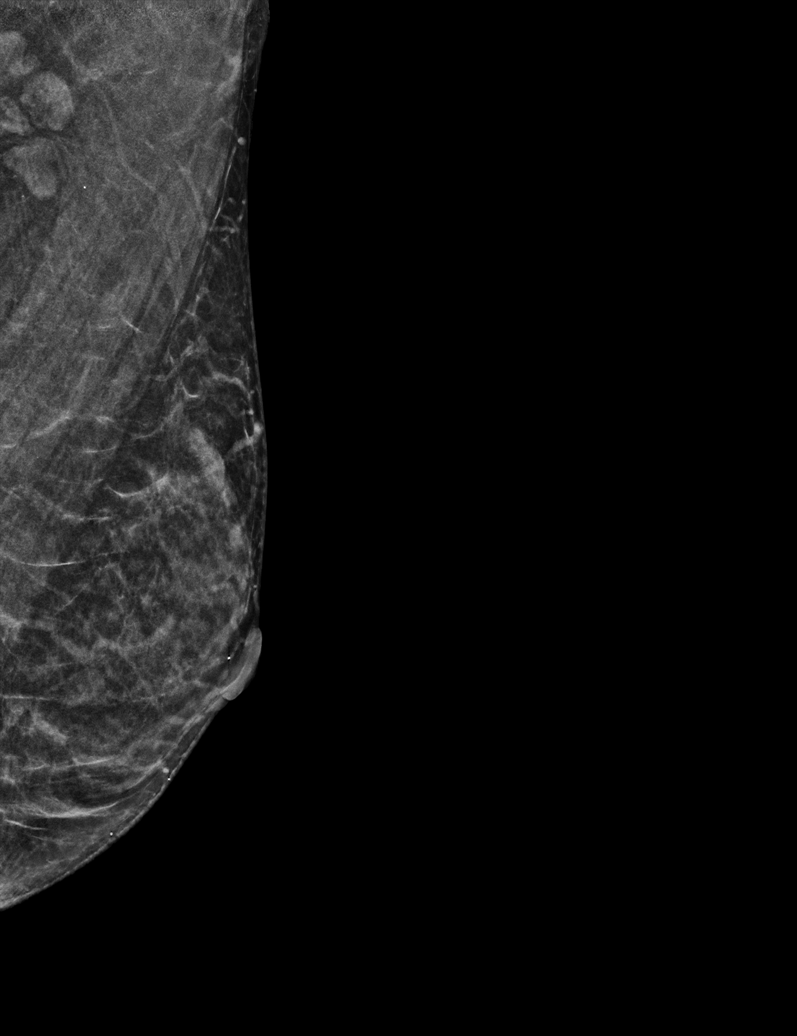

[R CC synth-2D]
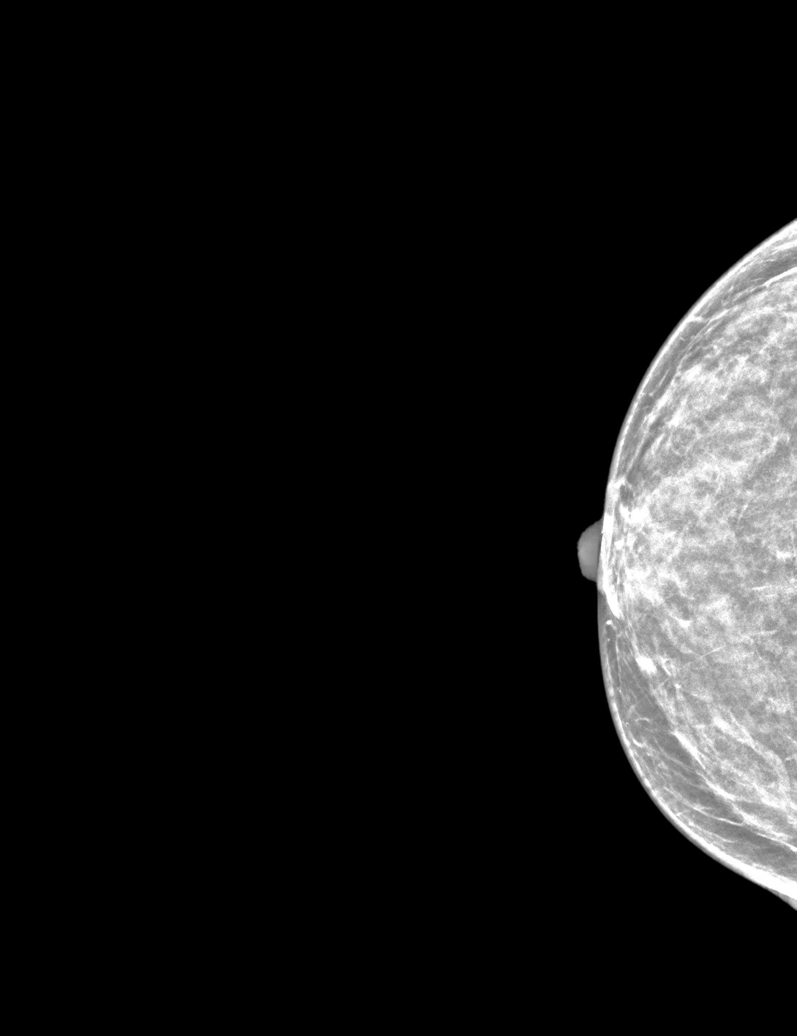

[R MLO synth-2D (2 of 2)]
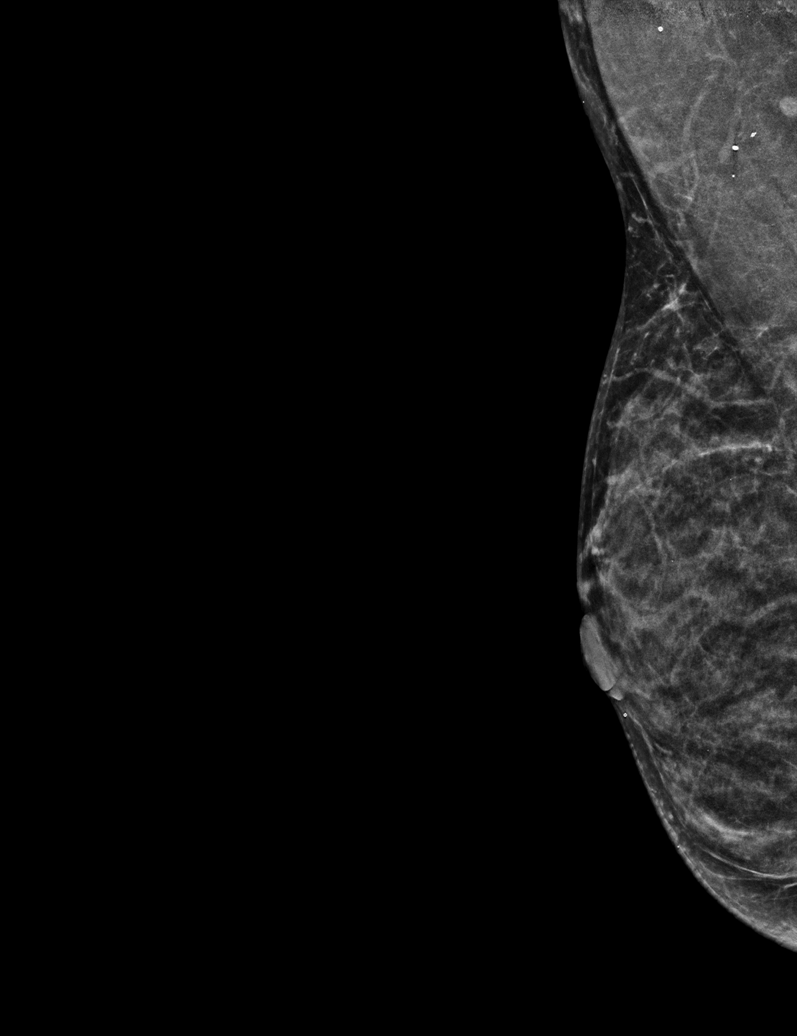

[R CC tomo · tomo slice 19/37.0]
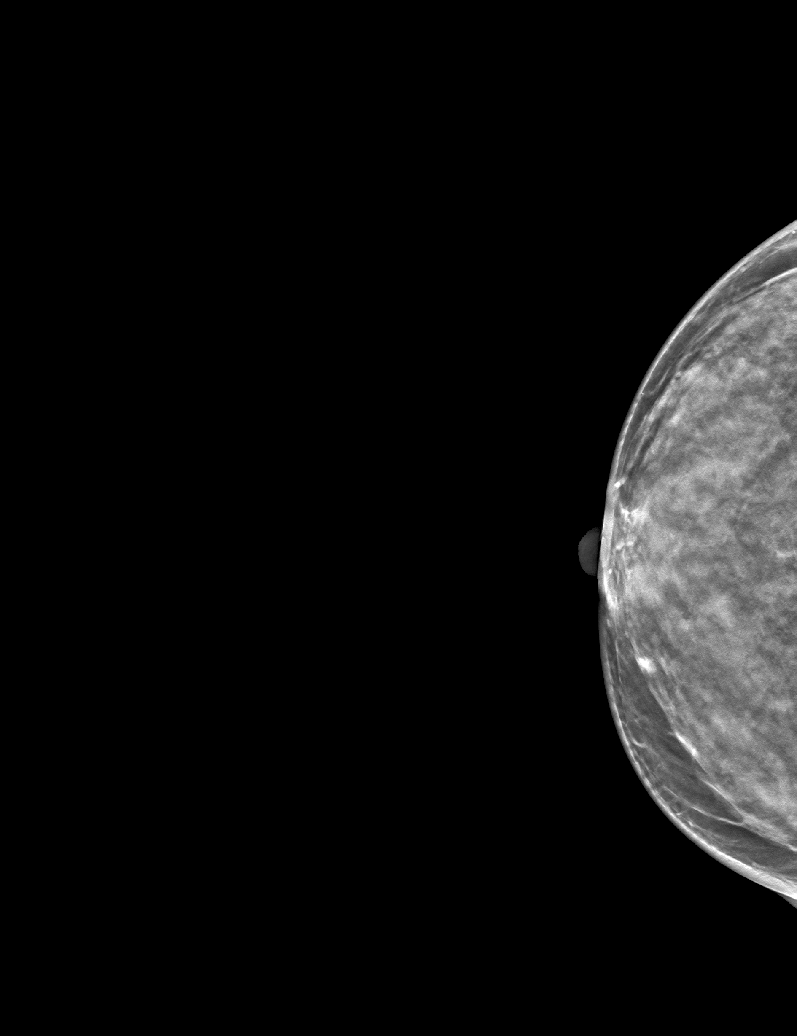

[6 of 30 positions shown; findings below may reference images not displayed]

ACR Breast Density Category d: The breast tissue is extremely dense,
which lowers the sensitivity of mammography
FINDINGS: There are no findings suspicious for malignancy.
IMPRESSION: No mammographic evidence of malignancy. A result letter of this
screening mammogram will be mailed directly to the patient.

RECOMMENDATION:
Screening mammogram in one year. (Code:TA-V-WV9)

BI-RADS CATEGORY  1: Negative.

## 2022-06-28 ENCOUNTER — Emergency Department
Admission: EM | Admit: 2022-06-28 | Discharge: 2022-06-28 | Discharge status: Against Medical Advice | Payer: BC Managed Care – PPO

## 2022-06-29 ENCOUNTER — Ambulatory Visit
Admission: RE | Admit: 2022-06-29 | Discharge: 2022-06-29 | Disposition: A | Discharge status: Home / Self Care | Payer: BC Managed Care – PPO | Source: Ambulatory Visit | Attending: Internal Medicine | Admitting: Internal Medicine

## 2022-06-29 ENCOUNTER — Encounter: Payer: Self-pay | Admitting: Internal Medicine

## 2022-06-29 ENCOUNTER — Telehealth: Payer: Self-pay

## 2022-06-29 ENCOUNTER — Ambulatory Visit: Payer: BC Managed Care – PPO | Admitting: Internal Medicine

## 2022-06-29 VITALS — BP 110/60 | HR 67 | Temp 98.0°F | Ht 69.61 in | Wt 141.6 lb

## 2022-06-29 DIAGNOSIS — R109 Unspecified abdominal pain: Secondary | ICD-10-CM | POA: Diagnosis not present

## 2022-06-29 DIAGNOSIS — D61818 Other pancytopenia: Secondary | ICD-10-CM | POA: Diagnosis not present

## 2022-06-29 DIAGNOSIS — N200 Calculus of kidney: Secondary | ICD-10-CM | POA: Insufficient documentation

## 2022-06-29 DIAGNOSIS — R1084 Generalized abdominal pain: Secondary | ICD-10-CM | POA: Diagnosis present

## 2022-06-29 DIAGNOSIS — Z1231 Encounter for screening mammogram for malignant neoplasm of breast: Secondary | ICD-10-CM

## 2022-06-29 DIAGNOSIS — R10A0 Flank pain, unspecified side: Secondary | ICD-10-CM

## 2022-06-29 DIAGNOSIS — R319 Hematuria, unspecified: Secondary | ICD-10-CM | POA: Diagnosis present

## 2022-06-29 DIAGNOSIS — Z1329 Encounter for screening for other suspected endocrine disorder: Secondary | ICD-10-CM | POA: Diagnosis not present

## 2022-06-29 DIAGNOSIS — R110 Nausea: Secondary | ICD-10-CM

## 2022-06-29 DIAGNOSIS — D563 Thalassemia minor: Secondary | ICD-10-CM | POA: Insufficient documentation

## 2022-06-29 DIAGNOSIS — Z13220 Encounter for screening for lipoid disorders: Secondary | ICD-10-CM

## 2022-06-29 HISTORY — DX: Calculus of kidney: N20.0

## 2022-06-29 LAB — CBC WITH DIFFERENTIAL/PLATELET
Basophils Absolute: 0 10*3/uL (ref 0.0–0.1)
Basophils Relative: 0.3 % (ref 0.0–3.0)
Eosinophils Absolute: 0 10*3/uL (ref 0.0–0.7)
Eosinophils Relative: 0.2 % (ref 0.0–5.0)
HCT: 32.2 % — ABNORMAL LOW (ref 36.0–46.0)
Hemoglobin: 10.5 g/dL — ABNORMAL LOW (ref 12.0–15.0)
Lymphocytes Relative: 21.3 % (ref 12.0–46.0)
Lymphs Abs: 1.9 10*3/uL (ref 0.7–4.0)
MCHC: 32.6 g/dL (ref 30.0–36.0)
MCV: 80.6 fl (ref 78.0–100.0)
Monocytes Absolute: 0.5 10*3/uL (ref 0.1–1.0)
Monocytes Relative: 6 % (ref 3.0–12.0)
Neutro Abs: 6.4 10*3/uL (ref 1.4–7.7)
Neutrophils Relative %: 72.2 % (ref 43.0–77.0)
Platelets: 142 10*3/uL — ABNORMAL LOW (ref 150.0–400.0)
RBC: 4 Mil/uL (ref 3.87–5.11)
RDW: 14.1 % (ref 11.5–15.5)
WBC: 8.9 10*3/uL (ref 4.0–10.5)

## 2022-06-29 LAB — COMPREHENSIVE METABOLIC PANEL
ALT: 12 U/L (ref 0–35)
AST: 14 U/L (ref 0–37)
Albumin: 4.1 g/dL (ref 3.5–5.2)
Alkaline Phosphatase: 48 U/L (ref 39–117)
BUN: 8 mg/dL (ref 6–23)
CO2: 26 mEq/L (ref 19–32)
Calcium: 9.1 mg/dL (ref 8.4–10.5)
Chloride: 104 mEq/L (ref 96–112)
Creatinine, Ser: 0.72 mg/dL (ref 0.40–1.20)
GFR: 100.06 mL/min (ref >60.00)
Glucose, Bld: 80 mg/dL (ref 70–99)
Potassium: 3.7 mEq/L (ref 3.5–5.1)
Sodium: 137 mEq/L (ref 135–145)
Total Bilirubin: 0.7 mg/dL (ref 0.2–1.2)
Total Protein: 7.7 g/dL (ref 6.0–8.3)

## 2022-06-29 LAB — TSH: TSH: 1.14 u[IU]/mL (ref 0.35–5.50)

## 2022-06-29 MED ORDER — CIPROFLOXACIN HCL 500 MG PO TABS: 500.0000 mg | ORAL_TABLET | Freq: Two times a day (BID) | ORAL | 0 refills | Status: AC

## 2022-06-29 MED ORDER — HYDROCODONE-ACETAMINOPHEN 5-325 MG PO TABS: 1.0000 | ORAL_TABLET | Freq: Two times a day (BID) | ORAL | 0 refills | Status: DC | PRN

## 2022-06-29 MED ORDER — TAMSULOSIN HCL 0.4 MG PO CAPS: 0.4000 mg | ORAL_CAPSULE | Freq: Every day | ORAL | 0 refills | Status: DC

## 2022-06-29 MED ORDER — ONDANSETRON HCL 4 MG PO TABS: 4.0000 mg | ORAL_TABLET | Freq: Three times a day (TID) | ORAL | 0 refills | Status: DC | PRN

## 2022-06-29 NOTE — Patient Instructions (Addendum)
[place] 4.7 6 [employer] reviews [facility] center in [place], [place] Address: [address], [place],  [address] Hours:  Open now    Add full hours    Phone: [phone] Check insurance info  [facility] [place] Dr. Jinny Blossom Morris/Tomblin/Lowe/[place]   [place]ie [place]er Dr. Christophe Louis in [place] ob/gyn  Dr. Servando Salina in [facility] ob/gyn  [facility] ob/gyn Dr. Charlesetta Garibaldi, Dr. Mancel Bale In [place]   Kidney Stones  Kidney stones are solid, rock-like deposits that form inside of the kidneys. The kidneys are a pair of organs that make urine. A kidney stone may form in a kidney and move into other parts of the urinary tract, including the tubes that connect the kidneys to the bladder (ureters), the bladder, and the tube that carries urine out of the body (urethra). As the stone moves through these areas, it can cause intense pain and block the flow of urine. Kidney stones are created when high levels of certain minerals are found in the urine. The stones are usually passed out of the body through urination, but in some cases, medical treatment may be needed to remove them. What are the causes? Kidney stones may be caused by: A condition in which certain glands produce too much parathyroid hormone (primary hyperparathyroidism), which causes too much calcium buildup in the blood. A buildup of uric acid crystals in the bladder (hyperuricosuria). Uric acid is a chemical that the body produces when you eat certain foods. It usually leaves the body in the urine. Narrowing (stricture) of one or both of the ureters. A kidney blockage that is present at birth (congenital obstruction). Past surgery on the kidney or the ureters. What increases the risk? The following factors may make you more likely to develop this condition: Having had a kidney stone in the past. Having a family history of kidney stones. Not drinking enough  water. Eating a diet that is high in protein, salt (sodium), or sugar. Being overweight or obese. What are the signs or symptoms? Symptoms of a kidney stone may include: Pain in the side of the abdomen, right below the ribs (flank pain). Pain usually spreads (radiates) to the groin. Needing to urinate often or urgently. Painful urination. Blood in the urine (hematuria). Nausea. Vomiting. Fever and chills. How is this diagnosed? This condition may be diagnosed based on: Your symptoms and medical history. A physical exam. Blood tests. Urine tests. These may be done before and after the stone passes out of your body through urination. Imaging tests, such as a CT scan, abdominal X-ray, or ultrasound. A procedure to examine the inside of the bladder (cystoscopy). How is this treated? Treatment for kidney stones depends on the size, location, and makeup of the stones. Kidney stones will often pass out of the body through urination. You may need to: Increase your fluid intake to help pass the stone. In some cases, you may be given fluids through an IV and may need to be monitored in the hospital. Take medicine for pain. Make changes in your diet to help prevent kidney stones from coming back. Sometimes, procedures are needed to remove a kidney stone. This may involve: A procedure to break up kidney stones using: A focused beam of light (laser therapy). Shock waves (extracorporeal shock wave lithotripsy). Surgery to remove kidney stones. This may be needed if you have severe pain or have stones that block your urinary tract. Follow these instructions at home: Medicines Take over-the-counter  and prescription medicines only as told by your health care provider. Ask your health care provider if the medicine prescribed to you requires you to avoid driving or using heavy machinery. Eating and drinking Drink enough fluid to keep your urine pale yellow. You may be instructed to drink at least  8-10 glasses of water each day. This will help you pass the kidney stone. If directed, change your diet. This may include: Limiting how much sodium you eat. Eating more fruits and vegetables. Limiting how much animal protein you eat. Animal proteins include red meat, poultry, fish, and eggs. Eating a normal amount of calcium (1,000-1,300 mg per day). Follow instructions from your health care provider about eating or drinking restrictions. General instructions Collect urine samples as told by your health care provider. You may need to collect a urine sample: 24 hours after you pass the stone. 8-12 weeks after you pass the kidney stone, and every 6-12 months after that. Strain your urine every time you urinate, for as long as directed. Use the strainer that your health care provider recommends. Do not throw out the kidney stone after passing it. Keep the stone so it can be tested by your health care provider. Testing the makeup of your kidney stone may help prevent you from getting kidney stones in the future. Keep all follow-up visits. You may need follow-up X-rays or ultrasounds to make sure that your stone has passed. How is this prevented? To prevent another kidney stone: Drink enough fluid to keep your urine pale yellow. This is the best way to prevent kidney stones. Eat a healthy diet. Follow recommendations from your health care provider about foods to avoid. Recommendations vary depending on the type of kidney stone that you have. You may be instructed to eat a low-protein diet. Maintain a healthy weight. Where to find more information National Kidney Foundation (NKF): www.kidney.org [facility]): www.urologyhealth.org Contact a health care provider if: You have pain that gets worse or does not get better with medicine. Get help right away if: You have a fever or chills. You develop severe pain. You develop new abdominal pain. You faint. You are unable to  urinate. Summary Kidney stones are solid, rock-like deposits that form inside of the kidneys. Kidney stones can cause nausea, vomiting, blood in the urine, abdominal pain, and the urge to urinate often. Treatment for kidney stones depends on the size, location, and makeup of the stones. Kidney stones will often pass out of the body through urination. Kidney stones can be prevented by drinking enough fluids, eating a healthy diet, and maintaining a healthy weight. This information is not intended to replace advice given to you by your health care provider. Make sure you discuss any questions you have with your health care provider. Document Revised: 12/15/2021 Document Reviewed: 12/15/2021 Elsevier Patient Education  2023 Elsevier Inc.  Flank Pain, Adult Flank pain is pain that is located on the side of the body between the upper abdomen and the spine. This area is called the flank. The pain may occur over a short period of time (acute), or it may be long-term or recurring (chronic). It may be mild or severe. Flank pain can be caused by many things, including: Muscle soreness or injury. Kidney infection, kidney stones, or kidney disease. Stress. A disease of the spine (vertebral disk disease). A lung infection (pneumonia). Fluid around the lungs (pulmonary edema). A skin rash caused by the chickenpox virus (shingles). Tumors that affect the back of the  abdomen. Gallbladder disease. Follow these instructions at home:  Drink enough fluid to keep your urine pale yellow. Rest as told by your health care provider. Take over-the-counter and prescription medicines only as told by your health care provider. Keep a journal to track what has caused your flank pain and what has made it feel better. Keep all follow-up visits. This is important. Contact a health care provider if: Your pain is not controlled with medicine. You have new symptoms. Your pain gets worse. Your symptoms last longer than  2-3 days. You have trouble urinating or you are urinating very frequently. Get help right away if: You have trouble breathing or you are short of breath. Your abdomen hurts or it is swollen or red. You have nausea or vomiting. You feel faint, or you faint. You have blood in your urine. You have flank pain and a fever. These symptoms may represent a serious problem that is an emergency. Do not wait to see if the symptoms will go away. Get medical help right away. Call your local emergency services (911 in the [place] Do not drive yourself to the hospital. Summary Flank pain is pain that is located on the side of the body between the upper abdomen and the spine. The pain may occur over a short period of time (acute), or it may be long-term or recurring (chronic). It may be mild or severe. Flank pain can be caused by many things. Contact your health care provider if your symptoms get worse or last longer than 2-3 days. This information is not intended to replace advice given to you by your health care provider. Make sure you discuss any questions you have with your health care provider. Document Revised: 11/16/2020 Document Reviewed: 11/16/2020 Elsevier Patient Education  2023 [employer].

## 2022-06-29 NOTE — Addendum Note (Signed)
Addended by: Orland Mustard on: 06/29/2022 12:58 PM   Modules accepted: Orders

## 2022-06-29 NOTE — Telephone Encounter (Signed)
Patient states she was disconnected from Dr. Olivia Mackie McLean-Scocuzza during a call regarding her test results.  I was unable to reach Gracy Racer, CMA, at the time of the call.  I let patient know that I will send a note.

## 2022-06-29 NOTE — Progress Notes (Signed)
Chief Complaint  Patient presents with   Back Pain    Back pain since Saturday and blood in the urine with painful urination not a burning sensation but a pain in the groin area also has noticed a strong odor from urine    F/u  Since Sunday had b/l back and flank pain and since mid 05/2022 lower pelvic pain/discomfort Sunday was worse 10/10 tried 2 12 hour relief nsaids otc with some relief, strong urine odor and blood in urine and pain in lower pelvic region with urination  She also has had nausea  Wet prep 06/14/22 neg BV and yeast + moderate bacteria    Review of Systems  Constitutional:  Negative for weight loss.  HENT:  Negative for hearing loss.   Eyes:  Negative for blurred vision.  Respiratory:  Negative for shortness of breath.   Cardiovascular:  Negative for chest pain.  Gastrointestinal:  Negative for abdominal pain and blood in stool.  Genitourinary:  Positive for flank pain and hematuria.       +hesistancy   Musculoskeletal:  Positive for back pain. Negative for falls and joint pain.  Skin:  Negative for rash.  Neurological:  Negative for headaches.  Psychiatric/Behavioral:  Negative for depression.    Past Medical History:  Diagnosis Date   COVID-19    04/2021   Graves disease    was on methimazole and disc thyroid removal and RAI never done in the past dx' in 2014 on meds x 2 years saw endocrine in [facility]   Migraines    Right arm fracture    3rd grade    Past Surgical History:  Procedure Laterality Date   teeth extracted     x4 for braces   Family History  Problem Relation Age of Onset   Arthritis Mother    Diabetes Mother    Hypertension Mother    Diabetes Father    Hypertension Father        ? FH of dad he is adopted   Ovarian cancer Sister    Depression Sister    Cancer Maternal Aunt        breast cancer   Breast cancer Maternal Aunt        mid 64s    Prostate cancer Maternal Uncle        x 2 uncles   Cancer Other        cervical cancer   Social  History   Socioeconomic History   Marital status: Married    Spouse name: Not on file   Number of children: Not on file   Years of education: Not on file   Highest education level: Not on file  Occupational History   Not on file  Tobacco Use   Smoking status: Former    Types: Cigars   Smokeless tobacco: Never   Tobacco comments:    Former social cigar smoker.   Substance and Sexual Activity   Alcohol use: Not on file   Drug use: Not on file   Sexual activity: Not on file  Other Topics Concern   Not on file  Social History Narrative   Married 3 kids as of 02/26/20 girl 34 y.o, boy 105 y.o and girl 46 y.o   Masters degree school counselor    From [place]   No etoh or smoking    Social Determinants of Health   Financial Resource Strain: Not on file  Food Insecurity: Not on file  Transportation Needs: Not on  file  Physical Activity: Not on file  Stress: Not on file  Social Connections: Not on file  Intimate Partner Violence: Not on file   Current Meds  Medication Sig   ciprofloxacin (CIPRO) 500 MG tablet Take 1 tablet (500 mg total) by mouth 2 (two) times daily for 5 days. With food   HYDROcodone-acetaminophen (NORCO) 5-325 MG tablet Take 1 tablet by mouth 2 (two) times daily as needed for moderate pain.   ondansetron (ZOFRAN) 4 MG tablet Take 1 tablet (4 mg total) by mouth every 8 (eight) hours as needed.   Allergies  Allergen Reactions   Cherry Anaphylaxis    Throat closes    Apple Juice Hives and Itching   Peach Flavor Hives and Itching   Plum Pulp Hives and Itching   Linnell Fulling     And related raw fruits: apples, peaches, prunes, pears, cherries, apricots, carrots, celery, almonds, hazelnuts and peanuts.   No results found for this or any previous visit (from the past 2160 hour(s)). Objective  Body mass index is 20.55 kg/m. Wt Readings from Last 3 Encounters:  06/29/22 141 lb 9.6 oz (64.2 kg)  09/07/21 145 lb 3.2 oz (65.9 kg)  02/26/20 146 lb 9.6 oz (66.5  kg)   Temp Readings from Last 3 Encounters:  06/29/22 98 F (36.7 C) (Oral)  09/07/21 97.9 F (36.6 C) (Temporal)  02/26/20 (!) 97.4 F (36.3 C) (Temporal)   BP Readings from Last 3 Encounters:  06/29/22 110/60  09/07/21 110/64  02/26/20 110/80   Pulse Readings from Last 3 Encounters:  06/29/22 67  09/07/21 79  02/26/20 62    Physical Exam Vitals and nursing note reviewed.  Constitutional:      Appearance: Normal appearance. She is well-developed and well-groomed.  HENT:     Head: Normocephalic and atraumatic.  Eyes:     Conjunctiva/sclera: Conjunctivae normal.     Pupils: Pupils are equal, round, and reactive to light.  Cardiovascular:     Rate and Rhythm: Normal rate and regular rhythm.     Heart sounds: Normal heart sounds. No murmur heard. Pulmonary:     Effort: Pulmonary effort is normal.     Breath sounds: Normal breath sounds.  Abdominal:     General: Abdomen is flat. Bowel sounds are normal.     Tenderness: There is abdominal tenderness in the suprapubic area.     Comments: Left flank pain subjective not on exam today mild  Musculoskeletal:        General: No tenderness.  Skin:    General: Skin is warm and dry.  Neurological:     General: No focal deficit present.     Mental Status: She is alert and oriented to person, place, and time. Mental status is at baseline.     Cranial Nerves: Cranial nerves 2-12 are intact.     Motor: Motor function is intact.     Coordination: Coordination is intact.     Gait: Gait is intact.  Psychiatric:        Attention and Perception: Attention and perception normal.        Mood and Affect: Mood and affect normal.        Speech: Speech normal.        Behavior: Behavior normal. Behavior is cooperative.        Thought Content: Thought content normal.        Cognition and Memory: Cognition and memory normal.        Judgment: Judgment  normal.     Assessment  Plan  Pelvic pain, Flank pain &hematuria ddx pyelo, kidney  stones r/o UTI- Plan: Comprehensive metabolic panel, CBC with Differential/Platelet, ciprofloxacin (CIPRO) 500 MG tablet bid x 5 days, HYDROcodone-acetaminophen (NORCO) 5-325 MG tablet bid prn, CT ABDOMEN PELVIS WO CONTRAST  Nausea - Plan: ondansetron (ZOFRAN) 4 MG tablet   HM Flu shot in future Tdap 08/2721 covid 4/4 moderna  hpv vaccine consider in future   Mammo mebane Pap h/o abnormal Dr. Elesa Massed 04/24/20 pap neg neg HPV kc ob/gy Colonoscopy consider age 7 had in 2014 and normal no FH colon cancer    Never smoker  ROI Dr. [place] Garrison in [facility] healthy diet and exercise    Provider: Dr. French Ana McLean-Scocuzza-Internal Medicine

## 2022-06-29 NOTE — Telephone Encounter (Signed)
Call pt back

## 2022-07-02 LAB — URINALYSIS, ROUTINE W REFLEX MICROSCOPIC
Bilirubin Urine: NEGATIVE
Glucose, UA: NEGATIVE
Hyaline Cast: NONE SEEN /LPF
Ketones, ur: NEGATIVE
Nitrite: POSITIVE — AB
Specific Gravity, Urine: 1.016 (ref 1.001–1.035)
WBC, UA: >=60 /HPF — AB (ref 0–5)
pH: 6.5 (ref 5.0–8.0)

## 2022-07-02 LAB — URINE CULTURE
MICRO NUMBER:: 14037390
SPECIMEN QUALITY:: ADEQUATE

## 2022-07-02 LAB — MICROSCOPIC MESSAGE

## 2022-07-03 ENCOUNTER — Other Ambulatory Visit: Payer: Self-pay | Admitting: Family

## 2022-07-04 ENCOUNTER — Telehealth: Payer: Self-pay

## 2022-07-04 DIAGNOSIS — B379 Candidiasis, unspecified: Secondary | ICD-10-CM

## 2022-07-04 MED ORDER — FLUCONAZOLE 150 MG PO TABS: 150.0000 mg | ORAL_TABLET | Freq: Once | ORAL | 0 refills | Status: AC

## 2022-07-04 NOTE — Telephone Encounter (Signed)
Marie Lewis, I sent in diflucan for pt if you would let her know  Marie Lewis, let me know if you need anything from me regarding urology referral to Dr Claudia Desanctis

## 2022-07-04 NOTE — Telephone Encounter (Signed)
Lvm for pt to return call in regards to lab results.  Per Padonda: Urine culture shows UTI. I will send in Cipro to the pharmacy for treatment.

## 2022-07-04 NOTE — Telephone Encounter (Signed)
Patient states Dr. Olivia Mackie McLean-Scocuzza had given her an antibiotic to take (ciprofloxacin (CIPRO) 500 MG tablet) and now she believes she has a yeast infection starting.  Patient states she is experiencing some discharge.  Patient states she would like to see if we can prescribe her something for the yeast infection.  Patient states she would like for [place] to please call her when medication has been sent to the pharmacy.  *[facility] [address] (not in [employer]).  Patient also states that Dr. Terese Door asked her to please call us if she hasn't heard from someone regarding scheduling her appointment with a urologist.  Patient states she has not heard from anyone.  I let patient know that the referral has been sent and gave her the phone number for Dr. Simone Curia Pace's office.

## 2022-07-29 ENCOUNTER — Ambulatory Visit: Payer: BC Managed Care – PPO | Admitting: Internal Medicine

## 2022-07-29 ENCOUNTER — Ambulatory Visit: Payer: BC Managed Care – PPO | Admitting: Family

## 2022-10-19 ENCOUNTER — Encounter: Payer: Self-pay | Admitting: Family Medicine

## 2022-10-19 ENCOUNTER — Ambulatory Visit: Payer: BC Managed Care – PPO | Admitting: Family Medicine

## 2022-10-19 VITALS — BP 108/68 | HR 81 | Temp 98.0°F | Resp 16 | Ht 68.75 in | Wt 144.4 lb

## 2022-10-19 DIAGNOSIS — Z13220 Encounter for screening for lipoid disorders: Secondary | ICD-10-CM

## 2022-10-19 DIAGNOSIS — G43919 Migraine, unspecified, intractable, without status migrainosus: Secondary | ICD-10-CM

## 2022-10-19 DIAGNOSIS — E0500 Thyrotoxicosis with diffuse goiter without thyrotoxic crisis or storm: Secondary | ICD-10-CM

## 2022-10-19 DIAGNOSIS — Z1329 Encounter for screening for other suspected endocrine disorder: Secondary | ICD-10-CM

## 2022-10-19 DIAGNOSIS — M542 Cervicalgia: Secondary | ICD-10-CM | POA: Diagnosis not present

## 2022-10-19 DIAGNOSIS — D649 Anemia, unspecified: Secondary | ICD-10-CM

## 2022-10-19 DIAGNOSIS — D696 Thrombocytopenia, unspecified: Secondary | ICD-10-CM | POA: Diagnosis not present

## 2022-10-19 DIAGNOSIS — D61818 Other pancytopenia: Secondary | ICD-10-CM

## 2022-10-19 MED ORDER — METHOCARBAMOL 750 MG PO TABS
750.0000 mg | ORAL_TABLET | Freq: Four times a day (QID) | ORAL | 0 refills | Status: AC
Start: 2022-10-19 — End: 2022-11-02

## 2022-10-19 NOTE — Patient Instructions (Addendum)
It was a pleasure meeting you today. Thank you for allowing me to take part in your health care.  Our goals for today as we discussed include:  Start Robaxin  Work on neck and shoulder posture Continue Tylenol 500 mg every 6 hours as needed Can alternate with Ibuprofen 200 mg every 8 hours as needed Can use Diclofenac gel 4 times a day as needed Continue heat/ice as needed  If no improvement in 4-6 weeks follow up with PCP  Schedule appointment for annual physical in the next few months.  Will need to also schedule an appointment for fasting lab work 1 week prior to office visit.  Please fast for 12 hours   If you have any questions or concerns, please do not hesitate to call the office at [phone].  I look forward to our next visit and until then take care and stay safe.  Regards,   [name], MD   [facility]

## 2022-10-19 NOTE — Progress Notes (Signed)
SUBJECTIVE:   Chief Complaint  Patient presents with   Establish Care    Transfer of care   HPI Patient presents to clinic transfer care  Neck pain Neck Pain: Paitent complains of neck pain. Event that precipitate these symptoms: none known. Onset of symptoms 4 weeks ago, unchanged since that time. Current symptoms are  pain with movement of neck from side-to-side.  Stretching type of pain . Patient denies  numbness/tingling or weakness in upper extremities . Patient has had no prior neck problems.  Previous treatments include: medication: NSAID: Ibuprofen with some relief, heating pad .  Migraines 3-4 migraines weekly.  Takes Maxalt for abortive therapy.  Last seen Dr. Brigitte Pulse 03/23, recommended continuing candesartan 4 mg nightly and Maxalt for abortive therapy.  Since then patient has self discontinued Atacand.    PERTINENT PMH / PSH: History of migraines Graves' disease History of PCOS  OBJECTIVE:  BP 108/68   Pulse 81   Temp 98 F (36.7 C)   Resp 16   Ht 5' 8.75" (1.746 m)   Wt 144 lb 6 oz (65.5 kg)   LMP 10/12/2022 (Exact Date)   SpO2 99%   BMI 21.48 kg/m    Physical Exam Vitals reviewed.  Constitutional:      General: She is not in acute distress.    Appearance: She is not ill-appearing.  HENT:     Head: Normocephalic.     Nose: Nose normal.  Eyes:     Conjunctiva/sclera: Conjunctivae normal.  Neck:     Thyroid: No thyromegaly or thyroid tenderness.  Cardiovascular:     Rate and Rhythm: Normal rate and regular rhythm.     Heart sounds: Normal heart sounds.  Pulmonary:     Effort: Pulmonary effort is normal.     Breath sounds: Normal breath sounds.  Abdominal:     General: Abdomen is flat. Bowel sounds are normal.     Palpations: Abdomen is soft.  Musculoskeletal:        General: Normal range of motion.     Cervical back: Normal range of motion. No edema, erythema, signs of trauma, rigidity, torticollis or tenderness. Muscular tenderness present. No  spinous process tenderness. Normal range of motion.  Lymphadenopathy:     Cervical: No cervical adenopathy.  Neurological:     Mental Status: She is alert and oriented to person, place, and time. Mental status is at baseline.  Psychiatric:        Mood and Affect: Mood normal.        Behavior: Behavior normal.        Thought Content: Thought content normal.        Judgment: Judgment normal.     ASSESSMENT/PLAN:  Neck pain Assessment & Plan: Suspect MSK given improvement with ibuprofen and heat. No cervical vertebral point tenderness, negative Spurling's test, low suspicion for radiculopathy. Start Robaxin  Work on neck and shoulder posture Neck exercises provided Continue Tylenol 500 mg every 6 hours as needed Can alternate with Ibuprofen 200 mg every 8 hours as needed Can use Diclofenac gel 4 times a day as needed Continue heat/ice as needed If no improvement 4 to 6 weeks follow-up with PCP, consider physical therapy at that time.  Orders: -     Methocarbamol; Take 1 tablet (750 mg total) by mouth 4 (four) times daily for 14 days.  Dispense: 56 tablet; Refill: 0  Thrombocytopenia (HCC) Assessment & Plan: Recent platelets improved from 113-142.  No signs of bleeding. Not on  anticoagulation. Recheck CBC today.  Orders: -     CBC with Differential/Platelet; Future  Lipid screening -     Lipid panel; Future  Graves disease -     Comprehensive metabolic panel; Future -     TSH; Future  Intractable migraine without status migrainosus, unspecified migraine type Assessment & Plan: Chronic.  Asymptomatic today.  3-4 migraines weekly.  Has discontinued candesartan.  Blood pressures remained soft. Could consider Nurtec or Roselyn Meier, have suggested she discuss this with her neurologist Follows with neurology, Dr. Manuella Ghazi.    Anemia, unspecified type -     Iron, TIBC and Ferritin Panel; Future   PDMP reviewed  Return for annual visit with fasting labs 1 week prior.  [name], MD

## 2022-10-21 ENCOUNTER — Encounter: Payer: Self-pay | Admitting: Family Medicine

## 2022-10-21 DIAGNOSIS — D696 Thrombocytopenia, unspecified: Secondary | ICD-10-CM | POA: Insufficient documentation

## 2022-10-21 DIAGNOSIS — Z13220 Encounter for screening for lipoid disorders: Secondary | ICD-10-CM | POA: Insufficient documentation

## 2022-10-21 NOTE — Assessment & Plan Note (Signed)
Recent platelets improved from 113-142.  No signs of bleeding. Not on anticoagulation. Recheck CBC today.

## 2022-10-21 NOTE — Assessment & Plan Note (Addendum)
Suspect MSK given improvement with ibuprofen and heat. No cervical vertebral point tenderness, negative Spurling's test, low suspicion for radiculopathy. Start Robaxin  Work on neck and shoulder posture Neck exercises provided Continue Tylenol 500 mg every 6 hours as needed Can alternate with Ibuprofen 200 mg every 8 hours as needed Can use Diclofenac gel 4 times a day as needed Continue heat/ice as needed If no improvement 4 to 6 weeks follow-up with PCP, consider physical therapy at that time.

## 2022-10-21 NOTE — Assessment & Plan Note (Signed)
Chronic.  Asymptomatic today.  3-4 migraines weekly.  Has discontinued candesartan.  Blood pressures remained soft. Could consider Nurtec or Roselyn Meier, have suggested she discuss this with her neurologist Follows with neurology, Dr. Manuella Ghazi.

## 2022-12-01 ENCOUNTER — Telehealth: Payer: Self-pay | Admitting: Family Medicine

## 2022-12-01 NOTE — Telephone Encounter (Signed)
I called and LVM informing the patient that the provider would not send in an antibiotic without the patient being seen and to call back and schedule with her or anyone in office.  Siddhant Hashemi,cma

## 2022-12-01 NOTE — Telephone Encounter (Signed)
Pt called in staying that she had a home test for UTI and it came back positive, and she was wondering if she can have some antibiotics sent over to [facility] [address]?? I offered some appt opening for tomorrow w/ [facility], but pt just said ok.

## 2022-12-02 NOTE — Telephone Encounter (Signed)
Patient returned office phone call at the end of business day. No appointments, patient wanted to schedule a Monday appt. Appointment was scheduled, patient may cancel and go to urgent care.

## 2022-12-05 ENCOUNTER — Ambulatory Visit: Payer: BC Managed Care – PPO | Admitting: Family Medicine

## 2022-12-05 NOTE — Telephone Encounter (Signed)
pt called stating she would like a urine test done prior to her coming in for a UTI. I let the pt know that we do not have any lab appointments available this week because we only have one lab tech here. Pt would like to be called

## 2022-12-05 NOTE — Telephone Encounter (Signed)
I called and LVM for the patient to call back and schedule a acute visit today with a provider and we can check her urine at the visit.  Garnette Greb,cma

## 2022-12-07 NOTE — Telephone Encounter (Signed)
LVM for patient to call back.   Marie Lewis,cma  

## 2023-03-09 ENCOUNTER — Other Ambulatory Visit (HOSPITAL_COMMUNITY)
Admission: RE | Admit: 2023-03-09 | Discharge: 2023-03-09 | Disposition: A | Discharge status: Home / Self Care | Payer: BC Managed Care – PPO | Source: Ambulatory Visit | Attending: Obstetrics and Gynecology | Admitting: Obstetrics and Gynecology

## 2023-03-09 ENCOUNTER — Other Ambulatory Visit: Payer: Self-pay | Admitting: Obstetrics and Gynecology

## 2023-03-09 DIAGNOSIS — Z01419 Encounter for gynecological examination (general) (routine) without abnormal findings: Secondary | ICD-10-CM | POA: Insufficient documentation

## 2023-03-14 LAB — CYTOLOGY - PAP
Comment: NEGATIVE
Diagnosis: UNDETERMINED — AB
High risk HPV: NEGATIVE

## 2023-03-27 ENCOUNTER — Encounter: Payer: BC Managed Care – PPO | Admitting: Family Medicine

## 2023-06-27 ENCOUNTER — Other Ambulatory Visit: Payer: Self-pay

## 2023-06-27 MED ORDER — MEDROXYPROGESTERONE ACETATE 150 MG/ML IM SUSY
150.0000 mg | PREFILLED_SYRINGE | INTRAMUSCULAR | 3 refills | Status: DC
  Filled 2023-06-27: qty 1, 90d supply, fill #0

## 2023-09-22 ENCOUNTER — Other Ambulatory Visit: Payer: Self-pay | Admitting: Student

## 2023-09-22 DIAGNOSIS — G43719 Chronic migraine without aura, intractable, without status migrainosus: Secondary | ICD-10-CM

## 2023-09-22 LAB — HM COLONOSCOPY

## 2023-09-28 ENCOUNTER — Encounter: Payer: Self-pay | Admitting: Student

## 2023-09-29 ENCOUNTER — Encounter: Payer: Self-pay | Admitting: Student

## 2023-10-05 ENCOUNTER — Ambulatory Visit
Admission: RE | Admit: 2023-10-05 | Discharge: 2023-10-05 | Disposition: A | Discharge status: Home / Self Care | Payer: 59 | Source: Ambulatory Visit | Attending: Student | Admitting: Student

## 2023-10-05 DIAGNOSIS — G43719 Chronic migraine without aura, intractable, without status migrainosus: Secondary | ICD-10-CM

## 2023-10-09 ENCOUNTER — Encounter: Payer: Self-pay | Admitting: Oncology

## 2023-10-09 ENCOUNTER — Inpatient Hospital Stay: Payer: 59

## 2023-10-09 ENCOUNTER — Inpatient Hospital Stay: Payer: 59 | Attending: Oncology | Admitting: Oncology

## 2023-10-09 VITALS — BP 110/76 | HR 77 | Temp 97.8°F | Resp 16 | Ht 68.75 in | Wt 143.3 lb

## 2023-10-09 DIAGNOSIS — D72819 Decreased white blood cell count, unspecified: Secondary | ICD-10-CM | POA: Insufficient documentation

## 2023-10-09 DIAGNOSIS — E0500 Thyrotoxicosis with diffuse goiter without thyrotoxic crisis or storm: Secondary | ICD-10-CM

## 2023-10-09 DIAGNOSIS — D649 Anemia, unspecified: Secondary | ICD-10-CM

## 2023-10-09 DIAGNOSIS — D563 Thalassemia minor: Secondary | ICD-10-CM | POA: Insufficient documentation

## 2023-10-09 DIAGNOSIS — D696 Thrombocytopenia, unspecified: Secondary | ICD-10-CM | POA: Diagnosis present

## 2023-10-09 DIAGNOSIS — D509 Iron deficiency anemia, unspecified: Secondary | ICD-10-CM | POA: Insufficient documentation

## 2023-10-09 DIAGNOSIS — Z87891 Personal history of nicotine dependence: Secondary | ICD-10-CM | POA: Diagnosis not present

## 2023-10-09 DIAGNOSIS — Z13220 Encounter for screening for lipoid disorders: Secondary | ICD-10-CM

## 2023-10-09 LAB — CBC (CANCER CENTER ONLY)
HCT: 31.4 % — ABNORMAL LOW (ref 36.0–46.0)
Hemoglobin: 10.6 g/dL — ABNORMAL LOW (ref 12.0–15.0)
MCH: 25.1 pg — ABNORMAL LOW (ref 26.0–34.0)
MCHC: 33.8 g/dL (ref 30.0–36.0)
MCV: 74.2 fL — ABNORMAL LOW (ref 80.0–100.0)
Platelet Count: 161 10*3/uL (ref 150–400)
RBC: 4.23 MIL/uL (ref 3.87–5.11)
RDW: 15.1 % (ref 11.5–15.5)
WBC Count: 2.9 10*3/uL — ABNORMAL LOW (ref 4.0–10.5)
nRBC: 0 % (ref 0.0–0.2)

## 2023-10-09 LAB — IRON AND TIBC
Iron: 40 ug/dL (ref 28–170)
Saturation Ratios: 9 % — ABNORMAL LOW (ref 10.4–31.8)
TIBC: 444 ug/dL (ref 250–450)
UIBC: 404 ug/dL

## 2023-10-09 LAB — FOLATE: Folate: 11.8 ng/mL (ref >5.9)

## 2023-10-09 LAB — VITAMIN B12: Vitamin B-12: 424 pg/mL (ref 180–914)

## 2023-10-09 LAB — FERRITIN: Ferritin: 6 ng/mL — ABNORMAL LOW (ref 11–307)

## 2023-10-09 NOTE — Progress Notes (Signed)
[facility]  [phone] [phone]  ID: Marie Lewis OB: 1976-07-09  MR#: [id number]  AOZ#:[id number]  Patient Care Team: Dana Allan, MD as PCP - General (Family Medicine)  CHIEF COMPLAINT: Alpha thalassemia and hemoglobin C trait, thrombocytopenia.  INTERVAL HISTORY: Patient is a 48 year old female was recently under the care of a hematologist in [place], [place] who is referred to establish care.  She currently feels well and is asymptomatic.  She has no neurologic complaints.  She denies any recent fevers or illnesses.  She has a good appetite and denies weight loss.  She has no chest pain, shortness of breath, cough, or hemoptysis.  She denies any nausea, vomiting, constipation, or diarrhea.  She has no urinary complaints.  Patient feels at her baseline and offers no specific complaints today.  REVIEW OF SYSTEMS:   Review of Systems  Constitutional: Negative.  Negative for fever, malaise/fatigue and weight loss.  Respiratory: Negative.  Negative for cough, hemoptysis and shortness of breath.   Cardiovascular: Negative.  Negative for chest pain and leg swelling.  Gastrointestinal: Negative.  Negative for abdominal pain.  Genitourinary: Negative.  Negative for dysuria.  Musculoskeletal: Negative.  Negative for back pain.  Skin: Negative.  Negative for rash.  Neurological: Negative.  Negative for dizziness, speech change, focal weakness and headaches.  Psychiatric/Behavioral: Negative.  The patient is not nervous/anxious.     As per HPI. Otherwise, a complete review of systems is negative.  PAST MEDICAL HISTORY: Past Medical History:  Diagnosis Date   COVID-19    04/2021   Graves disease    was on methimazole and disc thyroid removal and RAI never done in the past dx' in 2014 on meds x 2 years saw endocrine in [place]   Migraines    Pancytopenia (HCC) 03/03/2020   Cbc 12/24/12 WBC 3.01, H/H 11.6/33.9, plts 143, iron 58, tibc 200,  transferrin sat 29, ferritin 149.6, B12 450    Right arm fracture    3rd grade    Right nephrolithiasis 06/29/2022    PAST SURGICAL HISTORY: Past Surgical History:  Procedure Laterality Date   teeth extracted     x4 for braces    FAMILY HISTORY: Family History  Problem Relation Age of Onset   Arthritis Mother    Diabetes Mother    Hypertension Mother    Diabetes Father    Hypertension Father        ? FH of dad he is adopted   Ovarian cancer Sister    Depression Sister    Cancer Maternal Aunt        breast cancer   Breast cancer Maternal Aunt        mid 3s    Prostate cancer Maternal Uncle        x 2 uncles   Cancer Other        cervical cancer    ADVANCED DIRECTIVES (Y/N):  N  HEALTH MAINTENANCE: Social History   Tobacco Use   Smoking status: Former    Types: Cigars   Smokeless tobacco: Never   Tobacco comments:    Former social cigar smoker.   Substance Use Topics   Drug use: Not Currently     Colonoscopy:  PAP:  Bone density:  Lipid panel:  Allergies  Allergen Reactions   Cherry Anaphylaxis    Throat closes    Apple Juice Hives and Itching   Peach Flavoring Agent (Non-Screening) Hives and Itching   Plum Pulp Hives and Itching  [place]     And related raw fruits: apples, peaches, prunes, pears, cherries, apricots, carrots, celery, almonds, hazelnuts and peanuts.    Current Outpatient Medications  Medication Sig Dispense Refill   rizatriptan (MAXALT) 10 MG tablet Take 10 mg by mouth as needed.     venlafaxine XR (EFFEXOR-XR) 37.5 MG 24 hr capsule Take 37.5 mg by mouth daily with breakfast.     No current facility-administered medications for this visit.    OBJECTIVE: Vitals:   10/09/23 1131  BP: 110/76  Pulse: 77  Resp: 16  Temp: 97.8 F (36.6 C)  SpO2: 100%     Body mass index is 21.32 kg/m.    ECOG FS:0 - Asymptomatic  General: Well-developed, well-nourished, no acute distress. Eyes: Pink conjunctiva, anicteric  sclera. HEENT: Normocephalic, moist mucous membranes. Lungs: No audible wheezing or coughing. Heart: Regular rate and rhythm. Abdomen: Soft, nontender, no obvious distention. Musculoskeletal: No edema, cyanosis, or clubbing. Neuro: Alert, answering all questions appropriately. Cranial nerves grossly intact. Skin: No rashes or petechiae noted. Psych: Normal affect. Lymphatics: No cervical, calvicular, axillary or inguinal LAD.   LAB RESULTS:  Lab Results  Component Value Date   NA 137 06/29/2022   K 3.7 06/29/2022   CL 104 06/29/2022   CO2 26 06/29/2022   GLUCOSE 80 06/29/2022   BUN 8 06/29/2022   CREATININE 0.72 06/29/2022   CALCIUM 9.1 06/29/2022   PROT 7.7 06/29/2022   ALBUMIN 4.1 06/29/2022   AST 14 06/29/2022   ALT 12 06/29/2022   ALKPHOS 48 06/29/2022   BILITOT 0.7 06/29/2022    Lab Results  Component Value Date   WBC 2.9 (L) 10/09/2023   NEUTROABS 6.4 06/29/2022   HGB 10.6 (L) 10/09/2023   HCT 31.4 (L) 10/09/2023   MCV 74.2 (L) 10/09/2023   PLT 161 10/09/2023     STUDIES: MR BRAIN WO CONTRAST Result Date: 10/05/2023 CLINICAL DATA:  Provided history: 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 Additional history provided by the scanning technologist: The patient reports worsening migraine headaches, vision problems, nausea, numbness, difficulty speaking. EXAM: MRI HEAD WITHOUT CONTRAST TECHNIQUE: Multiplanar, multiecho pulse sequences of the brain and surrounding structures were obtained without intravenous contrast. COMPARISON:  None. FINDINGS: Brain: Cerebral volume is normal. There are a few tiny nonspecific foci of T2 FLAIR hyperintense signal abnormality scattered within the cerebral white matter. Partially empty sella turcica. No cortical encephalomalacia is identified. There is no acute infarct. No evidence of an intracranial mass. No chronic intracranial blood products. No extra-axial fluid collection. No midline shift. Vascular:  Maintained flow voids within the proximal large arterial vessels. Skull and upper cervical spine: No focal worrisome marrow lesion. Sinuses/Orbits: No mass or acute finding within the imaged orbits. Trace mucosal thickening within the bilateral ethmoid sinuses. IMPRESSION: 1.  No evidence of an acute intracranial abnormality. 2. Partially empty sella turcica. This finding can reflect incidental anatomic variation, or alternatively, it can be associated with chronic idiopathic intracranial hypertension (pseudotumor cerebri). 3. There are a few tiny nonspecific insults scattered within the cerebral white matter. 4. Otherwise unremarkable non-contrast MRI appearance the brain. Electronically Signed   By: Jackey Loge D.O.   On: 10/05/2023 19:04    ASSESSMENT:  Alpha thalassemia and hemoglobin C trait, thrombocytopenia.  PLAN:    Alpha thalassemia and hemoglobin C trait: It appears patient has a baseline microcytic anemia secondary to this.  Have ordered iron stores for completeness.  No other interventions are needed. Thrombocytopenia: Patient's platelet  count is within normal limits today.  All of her other laboratory work is pending at time of dictation. Leukopenia: Mild.  Patient appears to have an intermittent leukopenia.  Monitor.  I spent a total of 45 minutes reviewing chart data, face-to-face evaluation with the patient, counseling and coordination of care as detailed above.  Patient expressed understanding and was in agreement with this plan. She also understands that She can call clinic at any time with any questions, concerns, or complaints.    Jeralyn Ruths, MD   10/09/2023 12:56 PM

## 2023-10-09 NOTE — Progress Notes (Signed)
Patient states she has been seeing a hematologist for 5 years in [place] and just needed to establish care closer to home

## 2023-10-10 LAB — PLATELET ANTIBODY PROFILE
Glycoprotein IV Antibody: NEGATIVE
HLA Ab Ser Ql EIA: POSITIVE — AB
IA/IIA Antibody: NEGATIVE
IB/IX Antibody: NEGATIVE
IIB/IIIA Antibody: NEGATIVE

## 2023-10-30 ENCOUNTER — Inpatient Hospital Stay: Payer: 59 | Attending: Oncology | Admitting: Oncology

## 2023-10-30 DIAGNOSIS — D696 Thrombocytopenia, unspecified: Secondary | ICD-10-CM | POA: Diagnosis not present

## 2023-10-30 NOTE — Progress Notes (Signed)
 [facility]  [phone] [phone]  ID: Dhalia Krajnik OB: February 28, 1976  MR#: [id number]  XBM#:[id number]  Patient Care Team: Valli Gaw, MD as PCP - General (Family Medicine)  I connected with Verlene Glimpse on 10/30/23 at  3:30 PM EST by video enabled telemedicine visit and verified that I am speaking with the correct person using two identifiers.   I discussed the limitations, risks, security and privacy concerns of performing an evaluation and management service by telemedicine and the availability of in-person appointments. I also discussed with the patient that there may be a patient responsible charge related to this service. The patient expressed understanding and agreed to proceed.   Other persons participating in the visit and their role in the encounter: Patient, MD.  Patient's location: Car. Provider's location: Clinic.  CHIEF COMPLAINT: Alpha thalassemia and hemoglobin C trait, thrombocytopenia.  INTERVAL HISTORY: Patient agreed to video-assisted telemedicine visit for further evaluation and discussion of her laboratory results.  She continues to feel well and remains asymptomatic. She has no neurologic complaints.  She denies any recent fevers or illnesses.  She has a good appetite and denies weight loss.  She has no chest pain, shortness of breath, cough, or hemoptysis.  She denies any nausea, vomiting, constipation, or diarrhea.  She has no urinary complaints.  Patient offers no specific complaints today.  REVIEW OF SYSTEMS:   Review of Systems  Constitutional: Negative.  Negative for fever, malaise/fatigue and weight loss.  Respiratory: Negative.  Negative for cough, hemoptysis and shortness of breath.   Cardiovascular: Negative.  Negative for chest pain and leg swelling.  Gastrointestinal: Negative.  Negative for abdominal pain.  Genitourinary: Negative.  Negative for dysuria.  Musculoskeletal: Negative.  Negative for  back pain.  Skin: Negative.  Negative for rash.  Neurological: Negative.  Negative for dizziness, speech change, focal weakness and headaches.  Psychiatric/Behavioral: Negative.  The patient is not nervous/anxious.     As per HPI. Otherwise, a complete review of systems is negative.  PAST MEDICAL HISTORY: Past Medical History:  Diagnosis Date   COVID-19    04/2021   Graves disease    was on methimazole and disc thyroid removal and RAI never done in the past dx' in 2014 on meds x 2 years saw endocrine in [place]   Migraines    Pancytopenia (HCC) 03/03/2020   Cbc 12/24/12 WBC 3.01, H/H 11.6/33.9, plts 143, iron 58, tibc 200, transferrin sat 29, ferritin 149.6, B12 450    Right arm fracture    3rd grade    Right nephrolithiasis 06/29/2022    PAST SURGICAL HISTORY: Past Surgical History:  Procedure Laterality Date   teeth extracted     x4 for braces    FAMILY HISTORY: Family History  Problem Relation Age of Onset   Arthritis Mother    Diabetes Mother    Hypertension Mother    Diabetes Father    Hypertension Father        ? FH of dad he is adopted   Ovarian cancer Sister    Depression Sister    Cancer Maternal Aunt        breast cancer   Breast cancer Maternal Aunt        mid 36s    Prostate cancer Maternal Uncle        x 2 uncles   Cancer Other        cervical cancer    ADVANCED DIRECTIVES (Y/N):  N  HEALTH MAINTENANCE: Social History  Tobacco Use   Smoking status: Former    Types: Cigars   Smokeless tobacco: Never   Tobacco comments:    Former social cigar smoker.   Substance Use Topics   Drug use: Not Currently     Colonoscopy:  PAP:  Bone density:  Lipid panel:  Allergies  Allergen Reactions   Cherry Anaphylaxis    Throat closes    Apple Juice Hives and Itching   Peach Flavoring Agent (Non-Screening) Hives and Itching   Plum Pulp Hives and Itching   [place]     And related raw fruits: apples, peaches, prunes, pears, cherries, apricots,  carrots, celery, almonds, hazelnuts and peanuts.    Current Outpatient Medications  Medication Sig Dispense Refill   rizatriptan (MAXALT) 10 MG tablet Take 10 mg by mouth as needed.     venlafaxine XR (EFFEXOR-XR) 37.5 MG 24 hr capsule Take 37.5 mg by mouth daily with breakfast.     No current facility-administered medications for this visit.    OBJECTIVE: There were no vitals filed for this visit.    There is no height or weight on file to calculate BMI.    ECOG FS:0 - Asymptomatic  General: Well-developed, well-nourished, no acute distress. Eyes: Pink conjunctiva, anicteric sclera. HEENT: Normocephalic, moist mucous membranes. Lungs: No audible wheezing or coughing. Heart: Regular rate and rhythm. Abdomen: Soft, nontender, no obvious distention. Musculoskeletal: No edema, cyanosis, or clubbing. Neuro: Alert, answering all questions appropriately. Cranial nerves grossly intact. Skin: No rashes or petechiae noted. Psych: Normal affect.  LAB RESULTS:  Lab Results  Component Value Date   NA 137 06/29/2022   K 3.7 06/29/2022   CL 104 06/29/2022   CO2 26 06/29/2022   GLUCOSE 80 06/29/2022   BUN 8 06/29/2022   CREATININE 0.72 06/29/2022   CALCIUM 9.1 06/29/2022   PROT 7.7 06/29/2022   ALBUMIN 4.1 06/29/2022   AST 14 06/29/2022   ALT 12 06/29/2022   ALKPHOS 48 06/29/2022   BILITOT 0.7 06/29/2022    Lab Results  Component Value Date   WBC 2.9 (L) 10/09/2023   NEUTROABS 6.4 06/29/2022   HGB 10.6 (L) 10/09/2023   HCT 31.4 (L) 10/09/2023   MCV 74.2 (L) 10/09/2023   PLT 161 10/09/2023   Lab Results  Component Value Date   IRON 40 10/09/2023   TIBC 444 10/09/2023   IRONPCTSAT 9 (L) 10/09/2023   Lab Results  Component Value Date   FERRITIN 6 (L) 10/09/2023     STUDIES: MR BRAIN WO CONTRAST Result Date: 10/05/2023 CLINICAL DATA:  Provided history: 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 Additional history provided by the  scanning technologist: The patient reports worsening migraine headaches, vision problems, nausea, numbness, difficulty speaking. EXAM: MRI HEAD WITHOUT CONTRAST TECHNIQUE: Multiplanar, multiecho pulse sequences of the brain and surrounding structures were obtained without intravenous contrast. COMPARISON:  None. FINDINGS: Brain: Cerebral volume is normal. There are a few tiny nonspecific foci of T2 FLAIR hyperintense signal abnormality scattered within the cerebral white matter. Partially empty sella turcica. No cortical encephalomalacia is identified. There is no acute infarct. No evidence of an intracranial mass. No chronic intracranial blood products. No extra-axial fluid collection. No midline shift. Vascular: Maintained flow voids within the proximal large arterial vessels. Skull and upper cervical spine: No focal worrisome marrow lesion. Sinuses/Orbits: No mass or acute finding within the imaged orbits. Trace mucosal thickening within the bilateral ethmoid sinuses. IMPRESSION: 1.  No evidence of an acute intracranial abnormality. 2.  Partially empty sella turcica. This finding can reflect incidental anatomic variation, or alternatively, it can be associated with chronic idiopathic intracranial hypertension (pseudotumor cerebri). 3. There are a few tiny nonspecific insults scattered within the cerebral white matter. 4. Otherwise unremarkable non-contrast MRI appearance the brain. Electronically Signed   By: Bascom Lily D.O.   On: 10/05/2023 19:04    ASSESSMENT:  Alpha thalassemia and hemoglobin C trait, thrombocytopenia.  PLAN:    Alpha thalassemia and hemoglobin C trait: It appears patient has a baseline microcytic anemia.  Her most recent hemoglobin is 10.6 which is approximately her baseline, but she was noted also to have mildly decreased iron stores.  Have recommended oral iron supplementation.  Return to clinic in 4 months with repeat laboratory work and video-assisted telemedicine visit.  Iron  deficiency anemia: Oral iron supplementation as above. Thrombocytopenia: Patient's platelet count is within normal limits today.  Patient noted to have positive platelet antibodies which are likely clinically insignificant given her normal platelet levels.  No other intervention is needed.   Leukopenia: Mild.  Patient appears to have an intermittent leukopenia.  Monitor.  I provided 20 minutes of face-to-face video visit time during this encounter which included chart review, counseling, and coordination of care as documented above.   Patient expressed understanding and was in agreement with this plan. She also understands that She can call clinic at any time with any questions, concerns, or complaints.    Shellie Dials, MD   10/30/2023 3:45 PM

## 2023-11-01 ENCOUNTER — Encounter: Payer: Self-pay | Admitting: Obstetrics and Gynecology

## 2023-11-14 ENCOUNTER — Telehealth: Payer: Self-pay

## 2023-11-14 ENCOUNTER — Other Ambulatory Visit: Payer: Self-pay | Admitting: Family Medicine

## 2023-11-14 DIAGNOSIS — Z1231 Encounter for screening mammogram for malignant neoplasm of breast: Secondary | ICD-10-CM

## 2023-11-14 NOTE — Telephone Encounter (Signed)
 Copied from CRM [id number]. Topic: Referral - Request for Referral >> Nov 14, 2023  1:38 PM Clayton Bibles wrote: Did the patient discuss referral with their provider in the last year? Yes (If No - schedule appointment) (If Yes - send message)  Appointment offered? No - Christyanna will call and make an appointment with [facility]  Type of order/referral and detailed reason for visit: MM 3D SCREEN BREAST BILATERAL   Preference of office, provider, location: [facility]; [address], [place], [place] [address] If referral order, have you been seen by this specialty before? Yes (If Yes, this issue or another issue? When? Where?  Can we respond through MyChart? Yes

## 2023-11-14 NOTE — Telephone Encounter (Signed)
 Noted.

## 2023-11-30 ENCOUNTER — Ambulatory Visit
Admission: RE | Admit: 2023-11-30 | Discharge: 2023-11-30 | Disposition: A | Discharge status: Home / Self Care | Payer: 59 | Source: Ambulatory Visit | Attending: Family Medicine | Admitting: Family Medicine

## 2023-11-30 DIAGNOSIS — Z1231 Encounter for screening mammogram for malignant neoplasm of breast: Secondary | ICD-10-CM | POA: Insufficient documentation

## 2024-02-27 ENCOUNTER — Inpatient Hospital Stay: Payer: 59

## 2024-02-29 ENCOUNTER — Telehealth: Payer: 59 | Admitting: Oncology

## 2024-03-18 ENCOUNTER — Other Ambulatory Visit: Payer: Self-pay | Admitting: Obstetrics and Gynecology

## 2024-03-18 ENCOUNTER — Other Ambulatory Visit (HOSPITAL_COMMUNITY)
Admission: RE | Admit: 2024-03-18 | Discharge: 2024-03-18 | Disposition: A | Discharge status: Home / Self Care | Source: Ambulatory Visit | Attending: Obstetrics and Gynecology | Admitting: Obstetrics and Gynecology

## 2024-03-18 DIAGNOSIS — Z01419 Encounter for gynecological examination (general) (routine) without abnormal findings: Secondary | ICD-10-CM | POA: Diagnosis present

## 2024-03-27 LAB — CYTOLOGY - PAP
Comment: NEGATIVE
Diagnosis: UNDETERMINED — AB
High risk HPV: NEGATIVE

## 2024-10-24 ENCOUNTER — Encounter: Payer: Self-pay | Admitting: Nurse Practitioner

## 2024-10-24 ENCOUNTER — Ambulatory Visit: Admitting: Nurse Practitioner

## 2024-10-24 VITALS — BP 120/76 | HR 81 | Temp 99.0°F | Ht 68.75 in | Wt 155.8 lb

## 2024-10-24 DIAGNOSIS — R21 Rash and other nonspecific skin eruption: Secondary | ICD-10-CM

## 2024-10-24 DIAGNOSIS — Z13220 Encounter for screening for lipoid disorders: Secondary | ICD-10-CM

## 2024-10-24 DIAGNOSIS — G43919 Migraine, unspecified, intractable, without status migrainosus: Secondary | ICD-10-CM

## 2024-10-24 LAB — CBC WITH DIFFERENTIAL/PLATELET
Basophils Absolute: 0 10*3/uL (ref 0.0–0.1)
Basophils Relative: 0.3 % (ref 0.0–3.0)
Eosinophils Absolute: 0.1 10*3/uL (ref 0.0–0.7)
Eosinophils Relative: 2.4 % (ref 0.0–5.0)
HCT: 34.7 % — ABNORMAL LOW (ref 36.0–46.0)
Hemoglobin: 11.3 g/dL — ABNORMAL LOW (ref 12.0–15.0)
Lymphocytes Relative: 37.1 % (ref 12.0–46.0)
Lymphs Abs: 1.5 10*3/uL (ref 0.7–4.0)
MCHC: 32.4 g/dL (ref 30.0–36.0)
MCV: 78.1 fl (ref 78.0–100.0)
Monocytes Absolute: 0.3 10*3/uL (ref 0.1–1.0)
Monocytes Relative: 6.4 % (ref 3.0–12.0)
Neutro Abs: 2.2 10*3/uL (ref 1.4–7.7)
Neutrophils Relative %: 53.8 % (ref 43.0–77.0)
Platelets: 146 10*3/uL — ABNORMAL LOW (ref 150.0–400.0)
RBC: 4.44 Mil/uL (ref 3.87–5.11)
RDW: 15.4 % (ref 11.5–15.5)
WBC: 4.1 10*3/uL (ref 4.0–10.5)

## 2024-10-24 LAB — COMPREHENSIVE METABOLIC PANEL WITH GFR
ALT: 12 U/L (ref 3–35)
AST: 16 U/L (ref 5–37)
Albumin: 4.2 g/dL (ref 3.5–5.2)
Alkaline Phosphatase: 50 U/L (ref 39–117)
BUN: 8 mg/dL (ref 6–23)
CO2: 30 meq/L (ref 19–32)
Calcium: 9.1 mg/dL (ref 8.4–10.5)
Chloride: 105 meq/L (ref 96–112)
Creatinine, Ser: 0.76 mg/dL (ref 0.40–1.20)
GFR: 92.26 mL/min
Glucose, Bld: 84 mg/dL (ref 70–99)
Potassium: 4 meq/L (ref 3.5–5.1)
Sodium: 139 meq/L (ref 135–145)
Total Bilirubin: 0.4 mg/dL (ref 0.2–1.2)
Total Protein: 7.8 g/dL (ref 6.0–8.3)

## 2024-10-24 LAB — LIPID PANEL
Cholesterol: 143 mg/dL (ref 28–200)
HDL: 53.7 mg/dL
LDL Cholesterol: 76 mg/dL (ref 10–99)
NonHDL: 89.78
Total CHOL/HDL Ratio: 3
Triglycerides: 69 mg/dL (ref 10.0–149.0)
VLDL: 13.8 mg/dL (ref 0.0–40.0)

## 2024-10-24 LAB — TSH: TSH: 1.29 u[IU]/mL (ref 0.35–5.50)

## 2024-10-24 NOTE — Progress Notes (Unsigned)
 "  Established Patient Office Visit  Subjective:  Patient ID: Marie Lewis, female    DOB: 24-Apr-1976  Age: 49 y.o. MRN: [id number]  CC:  Chief Complaint  Patient presents with   Establish Care    Transfer of Care   Discussed the use of AI scribe software for clinical note transcription with the patient, who gave verbal consent to proceed.  History of Present Illness     Past Medical History:  Diagnosis Date   COVID-19    04/2021   Graves disease    was on methimazole and disc thyroid removal and RAI never done in the past dx' in 2014 on meds x 2 years saw endocrine in [place]   Migraines    Pancytopenia (HCC) 03/03/2020   Cbc 12/24/12 WBC 3.01, H/H 11.6/33.9, plts 143, iron 58, tibc 200, transferrin sat 29, ferritin 149.6, B12 450    Right arm fracture    3rd grade    Right nephrolithiasis 06/29/2022    Past Surgical History:  Procedure Laterality Date   teeth extracted     x4 for braces    Family History  Problem Relation Age of Onset   Arthritis Mother    Diabetes Mother    Hypertension Mother    Diabetes Father    Hypertension Father        ? FH of dad he is adopted   Ovarian cancer Sister    Depression Sister    Cancer Maternal Aunt        breast cancer   Breast cancer Maternal Aunt        mid 37s    Prostate cancer Maternal Uncle        x 2 uncles   Cancer Other        cervical cancer    Social History   Socioeconomic History   Marital status: Married    Spouse name: Not on file   Number of children: Not on file   Years of education: Not on file   Highest education level: Not on file  Occupational History   Not on file  Tobacco Use   Smoking status: Former    Types: Cigars   Smokeless tobacco: Never   Tobacco comments:    Former social cigar smoker.   Substance and Sexual Activity   Alcohol use: Not on file   Drug use: Not Currently   Sexual activity: Not on file  Other Topics Concern   Not on file  Social History Narrative    Married 3 kids as of 02/26/20 girl 87 y.o, boy 66 y.o and girl 49 y.o   Masters degree school counselor    From [place]   No etoh or smoking    Social Drivers of Health   Tobacco Use: Medium Risk (10/24/2024)   Patient History    Smoking Tobacco Use: Former    Smokeless Tobacco Use: Never    Passive Exposure: Not on Actuary Strain: Not on file  Food Insecurity: No Food Insecurity (10/09/2023)   Hunger Vital Sign    Worried About Running Out of Food in the Last Year: Never true    Ran Out of Food in the Last Year: Never true  Transportation Needs: No Transportation Needs (10/09/2023)   PRAPARE - Administrator, Civil Service (Medical): No    Lack of Transportation (Non-Medical): No  Physical Activity: Not on file  Stress: Not on file  Social Connections: Not on file  Intimate Partner Violence: Not At Risk (10/09/2023)   Humiliation, Afraid, Rape, and Kick questionnaire    Fear of Current or Ex-Partner: No    Emotionally Abused: No    Physically Abused: No    Sexually Abused: No  Depression (PHQ2-9): Low Risk (10/24/2024)   Depression (PHQ2-9)    PHQ-2 Score: 1  Alcohol Screen: Not on file  Housing: Unknown (10/09/2023)   Housing Stability Vital Sign    Unable to Pay for Housing in the Last Year: No    Number of Times Moved in the Last Year: Not on file    Homeless in the Last Year: No  Utilities: Not At Risk (10/09/2023)   AHC Utilities    Threatened with loss of utilities: No  Health Literacy: Not on file     Outpatient Medications Prior to Visit  Medication Sig Dispense Refill   rizatriptan (MAXALT) 10 MG tablet Take 10 mg by mouth as needed.     venlafaxine XR (EFFEXOR-XR) 37.5 MG 24 hr capsule Take 37.5 mg by mouth daily with breakfast.     No facility-administered medications prior to visit.    Allergies[1]  ROS Review of Systems Negative unless indicated in HPI.    Objective:    Physical Exam  BP 120/76   Pulse 81   Temp 99 F  (37.2 C)   Ht 5' 8.75 (1.746 m)   Wt 155 lb 12.8 oz (70.7 kg)   LMP 10/04/2024   SpO2 97%   BMI 23.18 kg/m  Wt Readings from Last 3 Encounters:  10/24/24 155 lb 12.8 oz (70.7 kg)  10/09/23 143 lb 4.8 oz (65 kg)  10/19/22 144 lb 6 oz (65.5 kg)     Health Maintenance  Topic Date Due   Hepatitis B Vaccines 19-59 Average Risk (1 of 3 - 19+ 3-dose series) Never done   COVID-19 Vaccine (6 - 2025-26 season) 05/20/2024   Influenza Vaccine  12/17/2024 (Originally 04/19/2024)   Mammogram  11/29/2025   Cervical Cancer Screening (HPV/Pap Cotest)  03/18/2029   DTaP/Tdap/Td (3 - Td or Tdap) 09/15/2031   Colonoscopy  09/21/2033   HPV VACCINES (No Doses Required) Completed   Hepatitis C Screening  Completed   HIV Screening  Completed   Pneumococcal Vaccine  Aged Out   Meningococcal B Vaccine  Aged Out       Topic Date Due   Hepatitis B Vaccines 19-59 Average Risk (1 of 3 - 19+ 3-dose series) Never done    Lab Results  Component Value Date   TSH 1.14 06/29/2022   Lab Results  Component Value Date   WBC 2.9 (L) 10/09/2023   HGB 10.6 (L) 10/09/2023   HCT 31.4 (L) 10/09/2023   MCV 74.2 (L) 10/09/2023   PLT 161 10/09/2023   Lab Results  Component Value Date   NA 137 06/29/2022   K 3.7 06/29/2022   CO2 26 06/29/2022   GLUCOSE 80 06/29/2022   BUN 8 06/29/2022   CREATININE 0.72 06/29/2022   BILITOT 0.7 06/29/2022   ALKPHOS 48 06/29/2022   AST 14 06/29/2022   ALT 12 06/29/2022   PROT 7.7 06/29/2022   ALBUMIN 4.1 06/29/2022   CALCIUM 9.1 06/29/2022   GFR 100.06 06/29/2022   Lab Results  Component Value Date   CHOL 133 09/15/2021   Lab Results  Component Value Date   HDL 50 09/15/2021   Lab Results  Component Value Date   LDLCALC 72 09/15/2021   Lab Results  Component Value  Date   TRIG 50 09/15/2021   Lab Results  Component Value Date   CHOLHDL 2.7 09/15/2021   No results found for: HGBA1C    Assessment & Plan:   Assessment & Plan   Assessment and  Plan Assessment & Plan       Follow-up: No follow-ups on file.   Evola Hollis, NP     [1]  Allergies Allergen Reactions   Cherry Anaphylaxis    Throat closes    Apple Juice Hives and Itching   Other Itching    Patient states cutting potatoes makes her hands itchy so she takes a allergy pill   Peach Flavoring Agent (Non-Screening) Hives and Itching   Plum Pulp Hives and Itching   Teresa Palin     And related raw fruits: apples, peaches, prunes, pears, cherries, apricots, carrots, celery, almonds, hazelnuts and peanuts.   "

## 2024-10-25 ENCOUNTER — Telehealth: Payer: Self-pay

## 2024-10-25 NOTE — Telephone Encounter (Signed)
 The Lab said INR can Not be added due to it has to be a different color top tube. Do you want me to schedule the patient to come in for this lab?
